# Patient Record
Sex: Male | Born: 1975 | Hispanic: Yes | Marital: Married | Smoking: Never smoker
Health system: Southern US, Community
[De-identification: ages and names within clinical notes are randomized; demographics above are authoritative.]

## PROBLEM LIST (undated history)

## (undated) DIAGNOSIS — I82409 Acute embolism and thrombosis of unspecified deep veins of unspecified lower extremity: Secondary | ICD-10-CM

## (undated) DIAGNOSIS — I10 Essential (primary) hypertension: Secondary | ICD-10-CM

---

## 2016-08-27 ENCOUNTER — Ambulatory Visit (INDEPENDENT_AMBULATORY_CARE_PROVIDER_SITE_OTHER)
Admission: EM | Admit: 2016-08-27 | Discharge: 2016-08-27 | Disposition: A | Discharge status: Other Acute Inpatient Hospital | Payer: Managed Care, Other (non HMO) | Source: Home / Self Care

## 2016-08-27 ENCOUNTER — Inpatient Hospital Stay: Payer: Managed Care, Other (non HMO)

## 2016-08-27 ENCOUNTER — Emergency Department: Payer: Managed Care, Other (non HMO)

## 2016-08-27 ENCOUNTER — Encounter: Payer: Self-pay | Admitting: Emergency Medicine

## 2016-08-27 ENCOUNTER — Inpatient Hospital Stay
Admission: EM | Admit: 2016-08-27 | Discharge: 2016-08-29 | DRG: 871 | Disposition: A | Discharge status: Home / Self Care | Payer: Managed Care, Other (non HMO) | Attending: Internal Medicine | Admitting: Internal Medicine

## 2016-08-27 DIAGNOSIS — E785 Hyperlipidemia, unspecified: Secondary | ICD-10-CM | POA: Diagnosis present

## 2016-08-27 DIAGNOSIS — Z792 Long term (current) use of antibiotics: Secondary | ICD-10-CM

## 2016-08-27 DIAGNOSIS — J9601 Acute respiratory failure with hypoxia: Secondary | ICD-10-CM

## 2016-08-27 DIAGNOSIS — I82412 Acute embolism and thrombosis of left femoral vein: Secondary | ICD-10-CM | POA: Diagnosis present

## 2016-08-27 DIAGNOSIS — A419 Sepsis, unspecified organism: Secondary | ICD-10-CM | POA: Diagnosis present

## 2016-08-27 DIAGNOSIS — I10 Essential (primary) hypertension: Secondary | ICD-10-CM | POA: Diagnosis present

## 2016-08-27 DIAGNOSIS — J9600 Acute respiratory failure, unspecified whether with hypoxia or hypercapnia: Secondary | ICD-10-CM

## 2016-08-27 DIAGNOSIS — I82502 Chronic embolism and thrombosis of unspecified deep veins of left lower extremity: Secondary | ICD-10-CM | POA: Diagnosis present

## 2016-08-27 DIAGNOSIS — R51 Headache: Secondary | ICD-10-CM

## 2016-08-27 DIAGNOSIS — M7989 Other specified soft tissue disorders: Secondary | ICD-10-CM

## 2016-08-27 DIAGNOSIS — Z7901 Long term (current) use of anticoagulants: Secondary | ICD-10-CM

## 2016-08-27 DIAGNOSIS — I2699 Other pulmonary embolism without acute cor pulmonale: Secondary | ICD-10-CM | POA: Diagnosis present

## 2016-08-27 DIAGNOSIS — R519 Headache, unspecified: Secondary | ICD-10-CM

## 2016-08-27 DIAGNOSIS — R7989 Other specified abnormal findings of blood chemistry: Secondary | ICD-10-CM

## 2016-08-27 DIAGNOSIS — R0902 Hypoxemia: Secondary | ICD-10-CM

## 2016-08-27 DIAGNOSIS — J189 Pneumonia, unspecified organism: Secondary | ICD-10-CM

## 2016-08-27 DIAGNOSIS — R778 Other specified abnormalities of plasma proteins: Secondary | ICD-10-CM

## 2016-08-27 DIAGNOSIS — R000 Tachycardia, unspecified: Secondary | ICD-10-CM | POA: Diagnosis present

## 2016-08-27 DIAGNOSIS — R0602 Shortness of breath: Secondary | ICD-10-CM | POA: Diagnosis not present

## 2016-08-27 DIAGNOSIS — Z79899 Other long term (current) drug therapy: Secondary | ICD-10-CM | POA: Diagnosis not present

## 2016-08-27 DIAGNOSIS — I82409 Acute embolism and thrombosis of unspecified deep veins of unspecified lower extremity: Secondary | ICD-10-CM | POA: Diagnosis not present

## 2016-08-27 DIAGNOSIS — J181 Lobar pneumonia, unspecified organism: Secondary | ICD-10-CM

## 2016-08-27 HISTORY — DX: Acute embolism and thrombosis of unspecified deep veins of unspecified lower extremity: I82.409

## 2016-08-27 HISTORY — DX: Essential (primary) hypertension: I10

## 2016-08-27 LAB — CBC WITH DIFFERENTIAL/PLATELET
BASOS PCT: 0 %
Basophils Absolute: 0 10*3/uL (ref 0–0.1)
EOS ABS: 0.1 10*3/uL (ref 0–0.7)
EOS PCT: 1 %
HCT: 47.5 % (ref 40.0–52.0)
Hemoglobin: 16 g/dL (ref 13.0–18.0)
Lymphocytes Relative: 11 %
Lymphs Abs: 1.3 10*3/uL (ref 1.0–3.6)
MCH: 28.4 pg (ref 26.0–34.0)
MCHC: 33.8 g/dL (ref 32.0–36.0)
MCV: 84 fL (ref 80.0–100.0)
MONO ABS: 0.7 10*3/uL (ref 0.2–1.0)
Monocytes Relative: 6 %
Neutro Abs: 9.2 10*3/uL — ABNORMAL HIGH (ref 1.4–6.5)
Neutrophils Relative %: 82 %
PLATELETS: 157 10*3/uL (ref 150–440)
RBC: 5.65 MIL/uL (ref 4.40–5.90)
RDW: 14.8 % — AB (ref 11.5–14.5)
WBC: 11.2 10*3/uL — AB (ref 3.8–10.6)

## 2016-08-27 LAB — MRSA PCR SCREENING: MRSA by PCR: NEGATIVE

## 2016-08-27 LAB — HEPARIN LEVEL (UNFRACTIONATED): HEPARIN UNFRACTIONATED: 0.65 [IU]/mL (ref 0.30–0.70)

## 2016-08-27 LAB — PROTIME-INR
INR: 1.07
Prothrombin Time: 13.9 seconds (ref 11.4–15.2)

## 2016-08-27 LAB — COMPREHENSIVE METABOLIC PANEL
ALT: 56 U/L (ref 17–63)
AST: 36 U/L (ref 15–41)
Albumin: 3.7 g/dL (ref 3.5–5.0)
Alkaline Phosphatase: 77 U/L (ref 38–126)
Anion gap: 7 (ref 5–15)
BUN: 16 mg/dL (ref 6–20)
CHLORIDE: 105 mmol/L (ref 101–111)
CO2: 27 mmol/L (ref 22–32)
CREATININE: 1.04 mg/dL (ref 0.61–1.24)
Calcium: 9 mg/dL (ref 8.9–10.3)
GFR calc non Af Amer: >60 mL/min (ref >60)
Glucose, Bld: 122 mg/dL — ABNORMAL HIGH (ref 65–99)
Potassium: 4.1 mmol/L (ref 3.5–5.1)
SODIUM: 139 mmol/L (ref 135–145)
Total Bilirubin: 0.7 mg/dL (ref 0.3–1.2)
Total Protein: 7.6 g/dL (ref 6.5–8.1)

## 2016-08-27 LAB — TROPONIN I: TROPONIN I: 0.2 ng/mL — AB (ref <0.03)

## 2016-08-27 LAB — APTT: APTT: 26 s (ref 24–36)

## 2016-08-27 LAB — LACTIC ACID, PLASMA: LACTIC ACID, VENOUS: 1.8 mmol/L (ref 0.5–1.9)

## 2016-08-27 LAB — BRAIN NATRIURETIC PEPTIDE: B NATRIURETIC PEPTIDE 5: 133 pg/mL — AB (ref 0.0–100.0)

## 2016-08-27 LAB — GLUCOSE, CAPILLARY: Glucose-Capillary: 136 mg/dL — ABNORMAL HIGH (ref 65–99)

## 2016-08-27 MED ORDER — METOPROLOL TARTRATE 5 MG/5ML IV SOLN
5.0000 mg | Freq: Four times a day (QID) | INTRAVENOUS | Status: DC | PRN
  Administered 2016-08-27: 5 mg via INTRAVENOUS
  Filled 2016-08-27 (×2): qty 5

## 2016-08-27 MED ORDER — OMEGA-3-ACID ETHYL ESTERS 1 G PO CAPS
1.0000 g | ORAL_CAPSULE | Freq: Two times a day (BID) | ORAL | Status: DC
  Administered 2016-08-27 – 2016-08-29 (×4): 1 g via ORAL
  Filled 2016-08-27 (×4): qty 1

## 2016-08-27 MED ORDER — ORAL CARE MOUTH RINSE
15.0000 mL | Freq: Two times a day (BID) | OROMUCOSAL | Status: DC
  Administered 2016-08-27 – 2016-08-29 (×4): 15 mL via OROMUCOSAL

## 2016-08-27 MED ORDER — HEPARIN (PORCINE) IN NACL 100-0.45 UNIT/ML-% IJ SOLN
1550.0000 [IU]/h | INTRAMUSCULAR | Status: DC
  Administered 2016-08-27 (×2): 1750 [IU]/h via INTRAVENOUS
  Filled 2016-08-27 (×3): qty 250

## 2016-08-27 MED ORDER — ACETAMINOPHEN 325 MG PO TABS
650.0000 mg | ORAL_TABLET | Freq: Four times a day (QID) | ORAL | Status: DC | PRN
  Administered 2016-08-28 – 2016-08-29 (×2): 650 mg via ORAL
  Filled 2016-08-27 (×2): qty 2

## 2016-08-27 MED ORDER — ALBUTEROL SULFATE (2.5 MG/3ML) 0.083% IN NEBU
2.5000 mg | INHALATION_SOLUTION | Freq: Once | RESPIRATORY_TRACT | Status: AC
  Administered 2016-08-27: 2.5 mg via RESPIRATORY_TRACT

## 2016-08-27 MED ORDER — TRAMADOL HCL 50 MG PO TABS: 50.0000 mg | ORAL_TABLET | Freq: Four times a day (QID) | ORAL | Status: DC | PRN

## 2016-08-27 MED ORDER — AZITHROMYCIN 250 MG PO TABS
250.0000 mg | ORAL_TABLET | Freq: Every day | ORAL | Status: DC
  Administered 2016-08-28 – 2016-08-29 (×2): 250 mg via ORAL
  Filled 2016-08-27 (×2): qty 1

## 2016-08-27 MED ORDER — ADULT MULTIVITAMIN W/MINERALS CH
1.0000 | ORAL_TABLET | Freq: Every day | ORAL | Status: DC
  Administered 2016-08-28 – 2016-08-29 (×2): 1 via ORAL
  Filled 2016-08-27 (×3): qty 1

## 2016-08-27 MED ORDER — ALBUTEROL SULFATE (2.5 MG/3ML) 0.083% IN NEBU: 2.5000 mg | INHALATION_SOLUTION | RESPIRATORY_TRACT | Status: DC | PRN

## 2016-08-27 MED ORDER — ACETAMINOPHEN 650 MG RE SUPP: 650.0000 mg | Freq: Four times a day (QID) | RECTAL | Status: DC | PRN

## 2016-08-27 MED ORDER — DEXTROSE 5 % IV SOLN
1.0000 g | Freq: Once | INTRAVENOUS | Status: AC
  Administered 2016-08-27: 1 g via INTRAVENOUS
  Filled 2016-08-27: qty 10

## 2016-08-27 MED ORDER — AZITHROMYCIN 500 MG IV SOLR
500.0000 mg | Freq: Once | INTRAVENOUS | Status: AC
  Administered 2016-08-27: 500 mg via INTRAVENOUS
  Filled 2016-08-27: qty 500

## 2016-08-27 MED ORDER — DEXTROSE 5 % IV SOLN
1.0000 g | INTRAVENOUS | Status: DC
  Administered 2016-08-28 – 2016-08-29 (×2): 1 g via INTRAVENOUS
  Filled 2016-08-27 (×4): qty 10

## 2016-08-27 MED ORDER — HEPARIN BOLUS VIA INFUSION
6000.0000 [IU] | Freq: Once | INTRAVENOUS | Status: AC
  Administered 2016-08-27: 6000 [IU] via INTRAVENOUS
  Filled 2016-08-27: qty 6000

## 2016-08-27 MED ORDER — IOPAMIDOL (ISOVUE-370) INJECTION 76%
75.0000 mL | Freq: Once | INTRAVENOUS | Status: AC | PRN
  Administered 2016-08-27: 75 mL via INTRAVENOUS

## 2016-08-27 MED ORDER — ONDANSETRON HCL 4 MG PO TABS: 4.0000 mg | ORAL_TABLET | Freq: Four times a day (QID) | ORAL | Status: DC | PRN

## 2016-08-27 MED ORDER — HYDRALAZINE HCL 20 MG/ML IJ SOLN: 10.0000 mg | Freq: Four times a day (QID) | INTRAMUSCULAR | Status: DC | PRN

## 2016-08-27 MED ORDER — GUAIFENESIN ER 600 MG PO TB12
1200.0000 mg | ORAL_TABLET | Freq: Two times a day (BID) | ORAL | Status: DC
  Administered 2016-08-27 – 2016-08-29 (×4): 1200 mg via ORAL
  Filled 2016-08-27 (×4): qty 2

## 2016-08-27 MED ORDER — ENOXAPARIN SODIUM 40 MG/0.4ML ~~LOC~~ SOLN: 40.0000 mg | SUBCUTANEOUS | Status: DC

## 2016-08-27 MED ORDER — ONDANSETRON HCL 4 MG/2ML IJ SOLN: 4.0000 mg | Freq: Four times a day (QID) | INTRAMUSCULAR | Status: DC | PRN

## 2016-08-27 MED ORDER — POLYETHYLENE GLYCOL 3350 17 G PO PACK: 17.0000 g | PACK | Freq: Every day | ORAL | Status: DC | PRN

## 2016-08-27 MED ORDER — METOPROLOL TARTRATE 5 MG/5ML IV SOLN: 5.0000 mg | Freq: Four times a day (QID) | INTRAVENOUS | Status: DC

## 2016-08-27 MED ORDER — SODIUM CHLORIDE 0.9 % IV SOLN
INTRAVENOUS | Status: DC
  Administered 2016-08-27 – 2016-08-28 (×3): via INTRAVENOUS

## 2016-08-27 NOTE — Progress Notes (Signed)
ANTICOAGULATION CONSULT NOTE - Initial Consult  Pharmacy Consult for Heparin Indication: pulmonary embolus  No Known Allergies  Patient Measurements: Height: 6' 1"  (185.4 cm) Weight: 267 lb 6.7 oz (121.3 kg) IBW/kg (Calculated) : 79.9 Heparin Dosing Weight: 105  Vital Signs: Temp: 100.2 F (37.9 C) (06/24 1600) Temp Source: Axillary (06/24 1600) BP: 133/94 (06/24 1800) Pulse Rate: 114 (06/24 1800)  Labs:  Recent Labs  08/27/16 1033 08/27/16 1210 08/27/16 1823  HGB 16.0  --   --   HCT 47.5  --   --   PLT 157  --   --   APTT  --  26  --   LABPROT  --  13.9  --   INR  --  1.07  --   HEPARINUNFRC  --   --  0.65  CREATININE 1.04  --   --   TROPONINI 0.20*  --   --     Estimated Creatinine Clearance: 126.3 mL/min (by C-G formula based on SCr of 1.04 mg/dL).   Medical History: Past Medical History:  Diagnosis Date  . DVT (deep venous thrombosis) ([place])   . Hypertension     Assessment: 41 y/o M with a h/o DVT admitted with PE. No h/o anticoagulants PTA.  Goal of Therapy:  Heparin level 0.3-0.7 units/ml Monitor platelets by anticoagulation protocol: Yes   Plan:  First HL therapeutic at 0.65. Continue drip at 1750 units/hr and check confirmation level in 6 hours.   Ramond Dial, Pharm.D, BCPS Clinical Pharmacist  08/27/2016,7:26 PM

## 2016-08-27 NOTE — H&P (Signed)
[facility] [place] NAME: Randall Lester    MR#:  [id number]  DATE OF BIRTH:  [birth year]  DATE OF ADMISSION:  08/27/2016  PRIMARY CARE PHYSICIAN: Valera Castle, MD   REQUESTING/REFERRING PHYSICIAN: Dr. Reita Cliche  CHIEF COMPLAINT:  Increasing shortness of breath, cough and postnasal drip  HISTORY OF PRESENT ILLNESS:  Randall Lester  is a 41 y.o. male with a known history of Hypertension not on any medications consist of the emergency room with history of cough and postnasal drip for 4 days. He denies any fever or chest pain was found to have increasing shortness of breath which got worse this morning and came to the emergency room with hypoxia sats in the 80s. Workup in the ER showed patient is tachycardic tachypneic elevated white count and chest x-ray significant for right middle lobe pneumonia. He received IV Rocephin and Zithromax and IV fluids. BNP is 133. Patient is being admitted with acute hypoxic respiratory failure, sepsis due to right lower lobe pneumonia.  PAST MEDICAL HISTORY:   Past Medical History:  Diagnosis Date  . DVT (deep venous thrombosis) ([place])   . Hypertension     PAST SURGICAL HISTOIRY:  History reviewed. No pertinent surgical history.  SOCIAL HISTORY:   Social History  Substance Use Topics  . Smoking status: Never Smoker  . Smokeless tobacco: Never Used  . Alcohol use Yes    FAMILY HISTORY:  History reviewed. No pertinent family history.  DRUG ALLERGIES:  No Known Allergies  REVIEW OF SYSTEMS:  Review of Systems  Constitutional: Positive for diaphoresis. Negative for chills, fever and weight loss.  HENT: Negative for ear discharge, ear pain and nosebleeds.   Eyes: Negative for blurred vision, pain and discharge.  Respiratory: Positive for cough and shortness of breath. Negative for sputum production, wheezing and stridor.   Cardiovascular: Negative for chest pain, palpitations,  orthopnea and PND.  Gastrointestinal: Negative for abdominal pain, diarrhea, nausea and vomiting.  Genitourinary: Negative for frequency and urgency.  Musculoskeletal: Negative for back pain and joint pain.  Neurological: Positive for weakness. Negative for sensory change, speech change and focal weakness.  Psychiatric/Behavioral: Negative for depression and hallucinations. The patient is not nervous/anxious.      MEDICATIONS AT HOME:   Prior to Admission medications   Medication Sig Start Date End Date Taking? Authorizing Provider  diphenhydrAMINE (BENADRYL) 25 MG tablet Take 25 mg by mouth every 6 (six) hours as needed.   Yes [provider]  guaiFENesin (MUCINEX) 600 MG 12 hr tablet Take 1,200 mg by mouth 2 (two) times daily as needed.   Yes [provider]  Multiple Vitamin (MULTIVITAMIN) tablet Take 1 tablet by mouth daily.   Yes [provider]  omega-3 acid ethyl esters (LOVAZA) 1 g capsule Take 1 g by mouth 2 (two) times daily.   Yes [provider]      VITAL SIGNS:  Blood pressure (!) 160/118, pulse (!) 129, resp. rate (!) 21, SpO2 94 %.  PHYSICAL EXAMINATION:  GENERAL:  41 y.o.-year-old patient lying in the bed with Mild acute distress.  EYES: Pupils equal, round, reactive to light and accommodation. No scleral icterus. Extraocular muscles intact.  HEENT: Head atraumatic, normocephalic. Oropharynx and nasopharynx clear. On nonrebreather mask NECK:  Supple, no jugular venous distention. No thyroid enlargement, no tenderness.  LUNGS: Decreased breath sounds bilaterally, no wheezing, rales,rhonchi or crepitation. No use of accessory muscles of respiration.  CARDIOVASCULAR: S1, S2 normal. No  murmurs, rubs, or gallops. Tachycardia ABDOMEN: Soft, nontender, nondistended. Bowel sounds present. No organomegaly or mass.  EXTREMITIES: No pedal edema, cyanosis, or clubbing. Chronic left lower extremity edema mild NEUROLOGIC: Cranial nerves II through  XII are intact. Muscle strength 5/5 in all extremities. Sensation intact. Gait not checked.  PSYCHIATRIC: The patient is alert and oriented x 3.  SKIN: No obvious rash, lesion, or ulcer.   LABORATORY PANEL:   CBC  Recent Labs Lab 08/27/16 1033  WBC 11.2*  HGB 16.0  HCT 47.5  PLT 157   ------------------------------------------------------------------------------------------------------------------  Chemistries   Recent Labs Lab 08/27/16 1033  NA 139  K 4.1  CL 105  CO2 27  GLUCOSE 122*  BUN 16  CREATININE 1.04  CALCIUM 9.0  AST 36  ALT 56  ALKPHOS 77  BILITOT 0.7   ------------------------------------------------------------------------------------------------------------------  Cardiac Enzymes  Recent Labs Lab 08/27/16 1033  TROPONINI 0.20*   ------------------------------------------------------------------------------------------------------------------  RADIOLOGY:  Dg Chest Port 1 View  Result Date: 08/27/2016 CLINICAL DATA:  Cough and not feeling well 4 days.  Hypoxia. EXAM: PORTABLE CHEST 1 VIEW COMPARISON:  None. FINDINGS: Lungs are adequately inflated with airspace consolidation over the right mid to lower lung likely right middle lobe pneumonia. No definite effusion. Subtle linear scarring/atelectasis left base. Cardiomediastinal silhouette is within normal. There is minimal calcified plaque over the aortic arch. Bones and soft tissues unremarkable. IMPRESSION: Airspace consolidation over the right midlung likely right middle lobe pneumonia. Aortic atherosclerosis. Electronically Signed   By: Marin Olp M.D.   On: 08/27/2016 10:38    EKG:   Sinus tachycardia IMPRESSION AND PLAN:   Randall Lester  is a 41 y.o. male with a known history of Hypertension not on any medications consist of the emergency room with history of cough and postnasal drip for 4 days. He denies any fever or chest pain was found to have increasing shortness of breath which  got worse this morning and came to the emergency room with hypoxia sats in the 80s. Workup in the ER showed patient is tachycardic tachypneic elevated white count and chest x-ray significant for right middle lobe pneumonia.  1. Acute hypoxic respiratory failure secondary to right middle lobe community acquired pneumonia -Admit to medical floor -IV Rocephin and Zithromax -Follow blood cultures sputum culture -Incentive spirometer -When necessary nebs -Consult pulmonary if no improvement -CT chest pending. Patient has history of DVT in the remote past not on any oral anticoagulation  2. Sepsis secondary to right middle lobe pneumonia -Patient presented with hypoxia, shortness of breath, tachypnea, tachycardia and chest x-ray abnormal with right middle lobe pneumonia and a limited white count 11.2 -IV antibiotics as above -Follow up labs  3. Tachycardia due to #1 and 2  4. Hyperlipidemia continue omega-3 fatty acid  5. DVT prophylaxis subcutaneous Lovenox  Above was discussed with patient's family  All the records are reviewed and case discussed with ED provider. Management plans discussed with the patient, family and they are in agreement.  CODE STATUS: Full  TOTAL CRITICAL TIME TAKING CARE OF THIS PATIENT: 50 minutes.    Ilhan Debenedetto M.D on 08/27/2016 at 11:37 AM  Between 7am to 6pm - Pager - [phone]  After 6pm go to [email or web address] - password [email or web address] [facility]  [facility]  Office  [phone]  CC: Primary care physician; Valera Castle, MD

## 2016-08-27 NOTE — Progress Notes (Signed)
Patient a CT scan chest shows bilateral PE with right heart strain, dense consolidation right lower lobe with possible right lung infarct.  Above was discussed with patient and family. Patient is to be started on IV heparin drip. Dr. Jefferson Fuel from ICU is here to see patient. Will admit patient to ICU.  Patient is a full code.

## 2016-08-27 NOTE — ED Triage Notes (Signed)
Pt with increasing SOB x past 4 days much worse this a.m.

## 2016-08-27 NOTE — ED Notes (Signed)
Pt stated he needed to urinate. Informed that he does not need to move around too much. Given urinal and informed to sit on side of bed to urinate. Family stepped outside door.

## 2016-08-27 NOTE — Progress Notes (Addendum)
Pt ultrasound positive for DVT in left femoral popliteal. Elink called and informed. Pt remains on heparin drip, 15 L NRB, currently in no apparent distress. Awaiting call back from Dr. Tamala Julian.

## 2016-08-27 NOTE — ED Notes (Signed)
Pt taken to CT via stretcher.

## 2016-08-27 NOTE — ED Notes (Signed)
Dr. Reita Cliche at bedside.

## 2016-08-27 NOTE — Consult Note (Signed)
[facility] Consultation   ASSESSMENT/PLAN   Bilateral submassive pulmonary emboli patient with bilateral pulmonary emboli. Prior history of left-sided DVT. Recent anterior cruciate ligament repair. Hemodynamics are stable with blood pressure 170. Although does have evidence of right ventricular strain would not give thrombolytics at this point unless patient becomes hemodynamically unstable. Oxygen saturation is 97% on nonrebreather mask. Agree with systemic anticoagulation and admission to the intensive care unit. His status deteriorates would most certainly give TPA. Will obtain lower extremity Dopplers and echocardiography to assess pulmonary artery pressure right and left ventricular function  Pneumonia. CT scan does reveal evidence of parenchymal infiltrates in the middle and superior segment of the right lower lobe. He does have some evidence of congestion, chills and mild leukocytosis agree with azithromycin and Rocephin empirically  Positive troponin at 0.2. EKG reveals sinus tachycardia without any acute ST segment changes. This most likely is reflective of supply demand ischemia and right ventricular overload    Mild leukocytosis. Started on broad-spectrum anabiotic coverage   INTAKE / OUTPUT: No intake or output data in the 24 hours ending 08/27/16 1233  Name: Arafat Cocuzza MRN: [id number] DOB: March 09, 1975    ADMISSION DATE:  08/27/2016 CONSULTATION DATE:  08/27/2016  REFERRING MD :  Hospitalist service  CHIEF COMPLAINT:  Shortness of breath   HISTORY OF PRESENT ILLNESS:  Mr. Gonyea is a very pleasant 41 year old gentleman with a past medical history remarkable for hypertension, deep venous thrombosis, recent anterior cruciate ligament repair, left lower extremity edema, presents with 4 days of progressive worsening cough, progressive shortness of breath, postnasal drip. He states that he initially thought he was having some allergic type  symptoms, had postnasal drip and congestion. He tried taking over-the-counter Mucinex and Benadryl without improvement. Today his progressive shortness of breath became much more severe prompting him to come to the emergency department.  CT scan angiogram of the chest revealed bilateral pulmonary emboli with evidence of right ventricular strain. Also noted was right middle and superior segment of the right lower lobe parenchymal infiltrates consistent with pneumonic process. He denies any hemoptysis, productive secretions, but does have some sweats and chills.   PAST MEDICAL HISTORY :  Past Medical History:  Diagnosis Date  . DVT (deep venous thrombosis) ([place])   . Hypertension    History reviewed. No pertinent surgical history. Prior to Admission medications   Medication Sig Start Date End Date Taking? Authorizing Provider  diphenhydrAMINE (BENADRYL) 25 MG tablet Take 25 mg by mouth every 6 (six) hours as needed.   Yes [provider]  guaiFENesin (MUCINEX) 600 MG 12 hr tablet Take 1,200 mg by mouth 2 (two) times daily as needed.   Yes [provider]  Multiple Vitamin (MULTIVITAMIN) tablet Take 1 tablet by mouth daily.   Yes [provider]  omega-3 acid ethyl esters (LOVAZA) 1 g capsule Take 1 g by mouth 2 (two) times daily.   Yes [provider]   No Known Allergies  FAMILY HISTORY:  History reviewed. No pertinent family history. SOCIAL HISTORY:  reports that he has never smoked. He has never used smokeless tobacco. He reports that he drinks alcohol. He reports that he does not use drugs.  REVIEW OF SYSTEMS:   Please see history of present illness for pertinent positives The remainder of systems were reviewed and were found to be negative other than what is documented in the HPI.    VITAL SIGNS: Pulse Rate:  [122-135] 132 (06/24 1200) Resp:  [  19-36] 22 (06/24 1200) BP: (153-170)/(103-124) 170/124 (06/24 1200) SpO2:  [72 %-94 %] 91 % (06/24  1200) Weight:  [117.9 kg (260 lb)] 117.9 kg (260 lb) (06/24 0942)   INTAKE / OUTPUT: No intake or output data in the 24 hours ending 08/27/16 1233  Physical Examination:   VS: BP (!) 170/124   Pulse (!) 132   Resp (!) 22   SpO2 91%   General Appearance: Mild respiratory distress, awake, alert Neuro:without focal findings, mental status, speech normal,. HEENT: PERRLA, EOM intact, no ptosis, no other lesions noticed;  Pulmonary: normal breath sounds., diaphragmatic excursion normal, accessory muscle utilization noted, essentially clear to auscultation CardiovascularNormal S1,S2.  No m/r/g.    Abdomen: Benign, Soft, non-tender, No masses, hepatosplenomegaly, No lymphadenopathy Renal:  No costovertebral tenderness  Skin:   warm, no rashes, no ecchymosis  Extremities: Asymmetric left lower extremity swelling   LABS: Reviewed   LABORATORY PANEL:   CBC  Recent Labs Lab 08/27/16 1033  WBC 11.2*  HGB 16.0  HCT 47.5  PLT 157    Chemistries   Recent Labs Lab 08/27/16 1033  NA 139  K 4.1  CL 105  CO2 27  GLUCOSE 122*  BUN 16  CREATININE 1.04  CALCIUM 9.0  AST 36  ALT 56  ALKPHOS 77  BILITOT 0.7    No results for input(s): GLUCAP in the last 168 hours. No results for input(s): PHART, PCO2ART, PO2ART in the last 168 hours.  Recent Labs Lab 08/27/16 1033  AST 36  ALT 56  ALKPHOS 77  BILITOT 0.7  ALBUMIN 3.7    Cardiac Enzymes  Recent Labs Lab 08/27/16 1033  TROPONINI 0.20*    RADIOLOGY:  Ct Angio Chest Pe W/cm &/or Wo Cm  Result Date: 08/27/2016 CLINICAL DATA:  Shortness of breath for 4 days, productive cough. History of DVT and left leg swelling. Decreased O2 sats. EXAM: CT ANGIOGRAPHY CHEST WITH CONTRAST TECHNIQUE: Multidetector CT imaging of the chest was performed using the standard protocol during bolus administration of intravenous contrast. Multiplanar CT image reconstructions and MIPs were obtained to evaluate the vascular anatomy. CONTRAST:   75 cc Isovue 370 COMPARISON:  None. FINDINGS: Cardiovascular: Moderate to large pulmonary emboli are seen within the distal left main pulmonary artery extending into the left interlobar pulmonary artery and multiple segmental pulmonary artery branches to the left upper lobe, lingula and left lower lobe. Additional moderate load of pulmonary embolus is seen within the right interlobar pulmonary artery extending into multiple segmental pulmonary artery branches to the right upper lobe, right middle lobe and right lower lobe. The right ventricle is distended indicating some degree of right heart strain. Overall heart size is normal. No pericardial effusion. Thoracic aorta is normal in caliber and configuration. Mediastinum/Nodes: No mass or enlarged lymph nodes within the mediastinum or perihilar regions. Small lymph nodes in the AP window region of the mediastinum. Normal residual thymic tissue in the anterior mediastinum. Lungs/Pleura: Dense consolidations within the right middle lobe and within the superior segment of the right lower lobe. Additional patchy ground-glass opacities within the right lower lobe and right middle lobe, most likely edema. Milder edema within the left lower lobe. Upper Abdomen: No acute findings. Musculoskeletal: No acute or significant osseous finding. Review of the MIP images confirms the above findings. IMPRESSION: 1. Positive for acute PE with CT evidence of right heart strain (RV/LV Ratio = nearly 2) consistent with at least submassive (intermediate risk) PE. The presence of right heart strain has  been associated with an increased risk of morbidity and mortality. Please activate Code PE by paging [phone]. Bilateral pulmonary emboli, as detailed above. 2. Dense consolidations within the right middle lobe and superior segment of the right lower lobe, likely multifocal pneumonia. 3. Additional patchy ground-glass opacities within the right middle lobe and right lower lobe, and to a  lesser degree in the left lower lobe, most likely pulmonary edema, some components likely sequela of pulmonary infarct. These results were called by telephone at the time of interpretation on 08/27/2016 at 11:55 am to Dr. Lisa Roca , who verbally acknowledged these results. Electronically Signed   By: Franki Cabot M.D.   On: 08/27/2016 12:04   Dg Chest Port 1 View  Result Date: 08/27/2016 CLINICAL DATA:  Cough and not feeling well 4 days.  Hypoxia. EXAM: PORTABLE CHEST 1 VIEW COMPARISON:  None. FINDINGS: Lungs are adequately inflated with airspace consolidation over the right mid to lower lung likely right middle lobe pneumonia. No definite effusion. Subtle linear scarring/atelectasis left base. Cardiomediastinal silhouette is within normal. There is minimal calcified plaque over the aortic arch. Bones and soft tissues unremarkable. IMPRESSION: Airspace consolidation over the right midlung likely right middle lobe pneumonia. Aortic atherosclerosis. Electronically Signed   By: Marin Olp M.D.   On: 08/27/2016 10:38    Hermelinda Dellen, DO [facility] Office Number: [phone]  Patricia Pesa, M.D.  Merton Border, M.D   08/27/2016, 12:33 PM

## 2016-08-27 NOTE — ED Triage Notes (Signed)
Pt to ED from [facility] with c/o of SOB x4 days. Pt woke this morning with increased difficulty. Pt has productive cough. Pt has hx of DVT and presents with swelling in left leg. Upon arrival  Pt placed on 6L and Sat at 80. Pt placed on non rebreather mask and Sat at 88%.

## 2016-08-27 NOTE — ED Provider Notes (Signed)
[facility] ____________________________________________  Time seen: Approximately 9:56 AM  I have reviewed the triage vital signs and the nursing notes.   HISTORY  Chief Complaint Shortness of Breath  HPI Randall Lester is a 41 y.o. male  present for evaluation of shortness of breath. Patient and wife at bedside report that for the last 4 days he has had progressing worsening cough with accompanied shortness of breath, woke up this morning very short of breath. Patient states that he initially thought he was having some allergy issues as he has family in town and didn't know if it was from perfumes or smells. Also reports that they have a niece that currently has nasal congestion. Patient reports he is taking over-the-counter Mucinex and Benadryl without change. Last took Mucinex and Benadryl this morning. Wife reports in the way to the urgent care he was starting to have episodes where he seems like he was passing out and she Did Talk to Him to Keep Him Awake. Patient Denies pain but only states he cannot take a full deep breath.  Reports history of high blood pressure, chronic left leg swelling with history of anterior cruciate ligament repair with DVT in left leg postoperatively. Patient states no chest pain or extremity pain. Denies any recent fevers. Denies history of asthma. States nonsmoker.Denies recent sickness. Denies recent antibiotic use.    Past Medical History:  Diagnosis Date  . DVT (deep venous thrombosis) ([place])   . Hypertension     There are no active problems to display for this patient.   History reviewed. No pertinent surgical history.   No current facility-administered medications for this encounter.  No current outpatient prescriptions on file.  Allergies Patient has no known allergies.  History reviewed. No pertinent family history.  Social History Social History  Substance Use Topics  . Smoking status: Never Smoker  . Smokeless  tobacco: Never Used  . Alcohol use Yes    Review of Systems Constitutional: No fever/chills ENT: No sore throat. Cardiovascular: Denies chest pain. Respiratory: positive shortness of breath. Gastrointestinal: No abdominal pain.    ____________________________________________   PHYSICAL EXAM:  VITAL SIGNS: ED Triage Vitals  Enc Vitals Group     BP 08/27/16 0933 (!) 168/103     Pulse Rate 08/27/16 0933 (!) 135     Resp 08/27/16 0933 (!) 36     Temp --      Temp src --      SpO2 08/27/16 0933 (!) 72 %     Weight 08/27/16 0942 260 lb (117.9 kg)     Height 08/27/16 0942 6' 1"  (1.854 m)     Head Circumference --      Peak Flow --      Pain Score --      Pain Loc --      Pain Edu? --      Excl. in [place]? --     Constitutional: Alert and oriented. Well appearing and in no acute distress. ENT      Mouth/Throat: Mucous membranes are moist.Oropharynx non-erythematous. No edema noted Cardiovascular: Normal rate, regular rhythm. Grossly normal heart sounds.  Good peripheral circulation. Respiratory: No wheezes. Diffuse rhonchi. Speaking 2-3 word sentences. Accessory muscle use. Gastrointestinal: Soft and nontender.  Musculoskeletal:  Left lower leg nonpitting edema, no edema noted to right leg. Left leg nontender. Bilateral pedal pulses equal and easily palpated. Neurologic:  Normal speech and language.  Speech is normal. No gait instability.  Skin:  Skin is warm, dry.  Psychiatric: Mood and affect are normal. Speech and behavior are normal. Patient exhibits appropriate insight and judgment   ___________________________________________   LABS (all labs ordered are listed, but only abnormal results are displayed)  Labs Reviewed - No data to display ____________________________________________  EKG  ED ECG REPORT I, Marylene Land, the attending provider, personally viewed and interpreted this ECG.   Date: 08/27/2016  EKG Time: 0938  Rate: 121  Rhythm: normal EKG, normal  sinus rhythm, unchanged from previous tracings, sinus tachycardia  Axis: normal  Intervals:none  ST&T Change: no ST or T wave elevation or depression noted  ____________________________________________  RADIOLOGY  No results found. ____________________________________________   PROCEDURES Procedures    INITIAL IMPRESSION / ASSESSMENT AND PLAN / ED COURSE  Pertinent labs & imaging results that were available during my care of the patient were reviewed by me and considered in my medical decision making (see chart for details).  Presenting with wife at bedside for respiratory distress and shortness of breath. Progressively worsening the last few days with quick decline this morning per patient. Patient oxygenation saturation 65-72% room air upon initial arrival. Nonrebreather nonrebreather applied and patient went to 90% at highest. 1 albuterol neb given without change shortness of breath or oxygenation saturation. EMS called Arrival. blood sugar 119. EKG sinus tach. IV established. EMS to transport to [facility]. Marya Amsler RN charge nurse called and given report. Wife and patient verbalized understanding and agreed to this plan.  ____________________________________________   FINAL CLINICAL IMPRESSION(S) / ED DIAGNOSES  Final diagnoses:  SOB (shortness of breath)  Acute respiratory failure with hypoxia ([place])     There are no discharge medications for this patient.   Note: This dictation was prepared with Dragon dictation along with smaller phrase technology. Any transcriptional errors that result from this process are unintentional.         Marylene Land, NP 08/27/16 1012

## 2016-08-27 NOTE — Progress Notes (Signed)
ANTICOAGULATION CONSULT NOTE - Initial Consult  Pharmacy Consult for Heparin Indication: pulmonary embolus  No Known Allergies  Patient Measurements:   Heparin Dosing Weight: 105  Vital Signs: BP: 160/118 (06/24 1130) Pulse Rate: 129 (06/24 1130)  Labs:  Recent Labs  08/27/16 1033  HGB 16.0  HCT 47.5  PLT 157  CREATININE 1.04  TROPONINI 0.20*    Estimated Creatinine Clearance: 124.5 mL/min (by C-G formula based on SCr of 1.04 mg/dL).   Medical History: Past Medical History:  Diagnosis Date  . DVT (deep venous thrombosis) ([place])   . Hypertension     Assessment: 41 y/o M with a h/o DVT admitted with PE. No h/o anticoagulants PTA.  Goal of Therapy:  Heparin level 0.3-0.7 units/ml Monitor platelets by anticoagulation protocol: Yes   Plan:  Give 6000 units bolus x 1 Start heparin infusion at 1750 units/hr Check anti-Xa level in 6 hours and daily while on heparin Continue to monitor H&H and platelets  Ulice Dash D 08/27/2016,12:11 PM

## 2016-08-27 NOTE — ED Provider Notes (Signed)
[facility] Emergency Department Provider Note ____________________________________________   I have reviewed the triage vital signs and the triage nursing note.  HISTORY  Chief Complaint Shortness of Breath   Historian Patient  HPI Randall Lester is a 41 y.o. male with a history of hypertension that has been controlled after weight loss, and a history of remote DVT in the left lower extremity which has caused some chronic leg swelling, but no active DVT that he knows of, and no new or worsening swelling or pain in his leg, presents from urgent care today after being found to have hypoxia into the 60s on room air.  Patient had presented with complaint for shortness of breath for approximately 4 days associated with worsening mildly productive cough. No fevers. He thought he might be having allergies to dogs, and tried Mucinex and Benadryl without any relief.  Denies chest pain. Denies pleuritic pain. Denies leg or calf pain, as well as does have chronic left lower extremity swelling.    Past Medical History:  Diagnosis Date  . DVT (deep venous thrombosis) ([place])   . Hypertension     Patient Active Problem List   Diagnosis Date Noted  . Sepsis ([place]) 08/27/2016  . Acute respiratory failure with hypoxia ([place]) 08/27/2016    History reviewed. No pertinent surgical history.  Prior to Admission medications   Medication Sig Start Date End Date Taking? Authorizing Provider  diphenhydrAMINE (BENADRYL) 25 MG tablet Take 25 mg by mouth every 6 (six) hours as needed.   Yes [provider]  guaiFENesin (MUCINEX) 600 MG 12 hr tablet Take 1,200 mg by mouth 2 (two) times daily as needed.   Yes [provider]  Multiple Vitamin (MULTIVITAMIN) tablet Take 1 tablet by mouth daily.   Yes [provider]  omega-3 acid ethyl esters (LOVAZA) 1 g capsule Take 1 g by mouth 2 (two) times daily.   Yes [provider]    No Known  Allergies  History reviewed. No pertinent family history.  Social History Social History  Substance Use Topics  . Smoking status: Never Smoker  . Smokeless tobacco: Never Used  . Alcohol use Yes    Review of Systems  Constitutional: Negative for fever. Eyes: Negative for visual changes. ENT: Negative for sore throat. Cardiovascular: Negative for chest pain. Respiratory: Positive for shortness of breath. Gastrointestinal: Negative for abdominal pain, vomiting and diarrhea. Genitourinary: Negative for dysuria. Musculoskeletal: Negative for back pain. Skin: Negative for rash. Neurological: Negative for headache.  ____________________________________________   PHYSICAL EXAM:  VITAL SIGNS: ED Triage Vitals  Enc Vitals Group     BP --      Pulse Rate 08/27/16 1028 (!) 123     Resp 08/27/16 1028 (!) 21     Temp --      Temp src --      SpO2 08/27/16 1023 (!) 80 %     Weight --      Height --      Head Circumference --      Peak Flow --      Pain Score --      Pain Loc --      Pain Edu? --      Excl. in [place]? --      Constitutional: Alert and oriented. Well appearing and Moderately dyspneic, trouble with full sentences. HEENT   Head: Normocephalic and atraumatic.      Eyes: Conjunctivae are normal. Pupils equal and round.  Ears:         Nose: No congestion/rhinnorhea.   Mouth/Throat: Mucous membranes are moist.   Neck: No stridor. Cardiovascular/Chest: Tachycardic, regular rhythm.  No murmurs, rubs, or gallops. Respiratory: Tachypnea with decreased breath sounds on the right and rhonchi bilateral bases. No clear wheezing. Gastrointestinal: Soft. No distention, no guarding, no rebound. Nontender.    Genitourinary/rectal:Deferred Musculoskeletal: Nontender with normal range of motion in all extremities. LLE with unilateral swelling without calf tenderness. Neurologic:  Normal speech and language. No gross or focal neurologic deficits are  appreciated. Skin:  Skin is warm, dry and intact. No rash noted. Psychiatric: Mood and affect are normal. Speech and behavior are normal. Patient exhibits appropriate insight and judgment.   ____________________________________________  LABS (pertinent positives/negatives)  Labs Reviewed  TROPONIN I - Abnormal; Notable for the following:       Result Value   Troponin I 0.20 (*)    All other components within normal limits  CBC WITH DIFFERENTIAL/PLATELET - Abnormal; Notable for the following:    WBC 11.2 (*)    RDW 14.8 (*)    Neutro Abs 9.2 (*)    All other components within normal limits  COMPREHENSIVE METABOLIC PANEL - Abnormal; Notable for the following:    Glucose, Bld 122 (*)    All other components within normal limits  BRAIN NATRIURETIC PEPTIDE - Abnormal; Notable for the following:    B Natriuretic Peptide 133.0 (*)    All other components within normal limits  CULTURE, BLOOD (ROUTINE X 2)  CULTURE, BLOOD (ROUTINE X 2)  LACTIC ACID, PLASMA  LACTIC ACID, PLASMA  APTT  PROTIME-INR    ____________________________________________    EKG I, Lisa Roca, MD, the attending physician have personally viewed and interpreted all ECGs.  124 bpm. Sinus tachycardia. Narrow QRS. Normal axis. Nonspecific ST and T-wave ____________________________________________  RADIOLOGY All Xrays were viewed by me. Imaging interpreted by Radiologist.  CXR portable:  IMPRESSION: Airspace consolidation over the right midlung likely right middle lobe pneumonia.  Aortic atherosclerosis.  CT angio chest: IMPRESSION: 1. Positive for acute PE with CT evidence of right heart strain (RV/LV Ratio = nearly 2) consistent with at least submassive (intermediate risk) PE. The presence of right heart strain has been associated with an increased risk of morbidity and mortality. Please activate Code PE by paging [phone]. Bilateral pulmonary emboli, as detailed above. 2. Dense consolidations  within the right middle lobe and superior segment of the right lower lobe, likely multifocal pneumonia. 3. Additional patchy ground-glass opacities within the right middle lobe and right lower lobe, and to a lesser degree in the left lower lobe, most likely pulmonary edema, some components likely sequela of pulmonary infarct.  These results were called by telephone at the time of interpretation on 08/27/2016 at 11:55 am to Dr. Lisa Roca , who verbally acknowledged these results. __________________________________________  PROCEDURES  Procedure(s) performed: None  Critical Care performed: CRITICAL CARE Performed by: Lisa Roca   Total critical care time: 45 minutes  Critical care time was exclusive of separately billable procedures and treating other patients.  Critical care was necessary to treat or prevent imminent or life-threatening deterioration.  Critical care was time spent personally by me on the following activities: development of treatment plan with patient and/or surrogate as well as nursing, discussions with consultants, evaluation of patient's response to treatment, examination of patient, obtaining history from patient or surrogate, ordering and performing treatments and interventions, ordering and review of laboratory studies, ordering and review of  radiographic studies, pulse oximetry and re-evaluation of patient's condition.   ____________________________________________   ED COURSE / ASSESSMENT AND PLAN  Pertinent labs & imaging results that were available during my care of the patient were reviewed by me and considered in my medical decision making (see chart for details).  Patient placed on nonrebreather and was still 89% on nonrebreather. He is not in significant distress, but does have short sentences due to feeling short of breath. No hypotension or significant hypertension. Denies fevers. Initial thought was most likely acute CHF with lower extremity  edema, or possible PE, although patient states that the left lower extremity edema is chronic. Portable chest x-ray shows asymmetric, right sided lower lobe infiltrate more consistent with pneumonia. At this point with tachycardia and hypoxia and infiltrate I did start patient on sepsis pathway and initiated antibiotics for community acquired pneumonia.  Hypoxia out of proportion to the chest x-ray findings and clinical scenario in terms of pneumonia but to CT of the chest.  I received call from radiologist reporting significant bilateral pulmonary emboli with evidence for right heart strain. In addition to this consolidations concerning for edema and/or pneumonia and/or pulmonary infarct.  I discussed these results with hospitalist, Dr. Posey Pronto who discussed with ICU attending Dr. Jefferson Fuel who recommends ICU admission but does not recommend Transfer for any thrombolysis or thrombectomy at this point.  Patient was ordered to start heparin.  Patient and family were updated.  O2 sat around 90% on nonrebreather. Patient relatively comfortable in terms of breathing and able to speak and interactive.  We discussed the minimal troponin 0.2, likely due to strain    CONSULTATIONS:   Hospitalist for admission - Dr. Posey Pronto, face to face.  Dr. Jefferson Fuel, ICU attending to admit.  Patient / Family / Caregiver informed of clinical course, medical decision-making process, and agree with plan.  ________________   FINAL CLINICAL IMPRESSION(S) / ED DIAGNOSES   Final diagnoses:  Community acquired pneumonia of left lower lobe of lung ([place])  Hypoxia  Bilateral pulmonary embolism ([facility])  Elevated troponin  Pulmonary infarct [facility])              Note: This dictation was prepared with Dragon dictation. Any transcriptional errors that result from this process are unintentional    Lisa Roca, MD 08/27/16 1213

## 2016-08-28 ENCOUNTER — Inpatient Hospital Stay: Payer: Managed Care, Other (non HMO)

## 2016-08-28 ENCOUNTER — Inpatient Hospital Stay: Admit: 2016-08-28 | Payer: Managed Care, Other (non HMO)

## 2016-08-28 ENCOUNTER — Inpatient Hospital Stay (HOSPITAL_COMMUNITY)
Admit: 2016-08-28 | Discharge: 2016-08-28 | Disposition: A | Discharge status: Home / Self Care | Payer: Managed Care, Other (non HMO) | Attending: Adult Health | Admitting: Adult Health

## 2016-08-28 DIAGNOSIS — I2699 Other pulmonary embolism without acute cor pulmonale: Secondary | ICD-10-CM

## 2016-08-28 LAB — ECHOCARDIOGRAM COMPLETE
Area-P 1/2: 2.59 cm2
CHL CUP MV DEC (S): 289
E decel time: 289 msec
EERAT: 6.17
FS: 33 % (ref 28–44)
Height: 73 in
IV/PV OW: 1.1
LA diam end sys: 40 mm
LA diam index: 1.58 cm/m2
LA vol A4C: 42.2 ml
LASIZE: 40 mm
LV E/e' medial: 6.17
LV TDI E'LATERAL: 14.4
LVEEAVG: 6.17
LVELAT: 14.4 cm/s
Lateral S' vel: 11.1 cm/s
MV Peak grad: 3 mmHg
MV pk E vel: 88.8 m/s
MVPKAVEL: 82.5 m/s
MVSPHT: 85 ms
PW: 9.94 mm — AB (ref 0.6–1.1)
TAPSE: 19.4 mm
TDI e' medial: 5.66
Weight: 4278.4 oz

## 2016-08-28 LAB — BASIC METABOLIC PANEL
ANION GAP: 7 (ref 5–15)
BUN: 14 mg/dL (ref 6–20)
CALCIUM: 8.8 mg/dL — AB (ref 8.9–10.3)
CO2: 24 mmol/L (ref 22–32)
Chloride: 104 mmol/L (ref 101–111)
Creatinine, Ser: 0.8 mg/dL (ref 0.61–1.24)
GLUCOSE: 136 mg/dL — AB (ref 65–99)
POTASSIUM: 4.5 mmol/L (ref 3.5–5.1)
Sodium: 135 mmol/L (ref 135–145)

## 2016-08-28 LAB — CBC
HCT: 45 % (ref 40.0–52.0)
HEMOGLOBIN: 15.3 g/dL (ref 13.0–18.0)
MCH: 28.3 pg (ref 26.0–34.0)
MCHC: 34 g/dL (ref 32.0–36.0)
MCV: 83.3 fL (ref 80.0–100.0)
PLATELETS: 144 10*3/uL — AB (ref 150–440)
RBC: 5.41 MIL/uL (ref 4.40–5.90)
RDW: 14.7 % — ABNORMAL HIGH (ref 11.5–14.5)
WBC: 12.7 10*3/uL — AB (ref 3.8–10.6)

## 2016-08-28 LAB — HEPARIN LEVEL (UNFRACTIONATED)
Heparin Unfractionated: 0.75 IU/mL — ABNORMAL HIGH (ref 0.30–0.70)
Heparin Unfractionated: 0.54 IU/mL (ref 0.30–0.70)

## 2016-08-28 LAB — MAGNESIUM: MAGNESIUM: 1.9 mg/dL (ref 1.7–2.4)

## 2016-08-28 LAB — PHOSPHORUS: PHOSPHORUS: 3.2 mg/dL (ref 2.5–4.6)

## 2016-08-28 MED ORDER — HYDRALAZINE HCL 20 MG/ML IJ SOLN
10.0000 mg | Freq: Once | INTRAMUSCULAR | Status: AC
  Administered 2016-08-28: 10 mg via INTRAVENOUS
  Filled 2016-08-28: qty 1

## 2016-08-28 MED ORDER — LISINOPRIL 10 MG PO TABS: 10.0000 mg | ORAL_TABLET | Freq: Every day | ORAL | Status: DC

## 2016-08-28 MED ORDER — HEPARIN (PORCINE) IN NACL 100-0.45 UNIT/ML-% IJ SOLN: 1550.0000 [IU]/h | INTRAMUSCULAR | Status: DC

## 2016-08-28 MED ORDER — APIXABAN 5 MG PO TABS: 5.0000 mg | ORAL_TABLET | Freq: Two times a day (BID) | ORAL | Status: DC

## 2016-08-28 MED ORDER — APIXABAN 5 MG PO TABS
10.0000 mg | ORAL_TABLET | Freq: Two times a day (BID) | ORAL | Status: DC
  Administered 2016-08-28 – 2016-08-29 (×3): 10 mg via ORAL
  Filled 2016-08-28 (×3): qty 2

## 2016-08-28 NOTE — Progress Notes (Addendum)
Report called to [facility] on 1C, pt transported by NT with supp O2.  VSS stable, MD started lisinopril for elevated BP

## 2016-08-28 NOTE — Progress Notes (Signed)
ANTICOAGULATION CONSULT NOTE - Initial Consult  Pharmacy Consult for Heparin Indication: pulmonary embolus  No Known Allergies  Patient Measurements: Height: 6' 1"  (185.4 cm) Weight: 267 lb 6.7 oz (121.3 kg) IBW/kg (Calculated) : 79.9 Heparin Dosing Weight: 105  Vital Signs: Temp: 97.9 F (36.6 C) (06/25 0000) Temp Source: Oral (06/25 0000) BP: 132/91 (06/25 0400) Pulse Rate: 79 (06/25 0400)  Labs:  Recent Labs  08/27/16 1033 08/27/16 1210 08/27/16 1823 08/28/16 0053  HGB 16.0  --   --  15.3  HCT 47.5  --   --  45.0  PLT 157  --   --  144*  APTT  --  26  --   --   LABPROT  --  13.9  --   --   INR  --  1.07  --   --   HEPARINUNFRC  --   --  0.65 0.75*  CREATININE 1.04  --   --  0.80  TROPONINI 0.20*  --   --   --     Estimated Creatinine Clearance: 164.2 mL/min (by C-G formula based on SCr of 0.8 mg/dL).   Medical History: Past Medical History:  Diagnosis Date  . DVT (deep venous thrombosis) ([place])   . Hypertension     Assessment: 41 y/o M with a h/o DVT admitted with PE. No h/o anticoagulants PTA.  Goal of Therapy:  Heparin level 0.3-0.7 units/ml Monitor platelets by anticoagulation protocol: Yes   Plan:  First HL therapeutic at 0.65. Continue drip at 1750 units/hr and check confirmation level in 6 hours.   6/25 @ 0100 HL 0.75 supratherapeutic. Will decrease rate to 1550 units/hr and will recheck HL @ 1100.  Tobie Lords, PharmD, BCPS Clinical Pharmacist 08/28/2016

## 2016-08-28 NOTE — Progress Notes (Signed)
[place] [facility] [place] NAME: Randall Lester    MR#:  [id number]  DATE OF BIRTH:  June 07, 1975  SUBJECTIVE:  CHIEF COMPLAINT:   Chief Complaint  Patient presents with  . Shortness of Breath  feeling much better. No new complaints REVIEW OF SYSTEMS:  Review of Systems  Constitutional: Negative for chills, fever and weight loss.  HENT: Negative for nosebleeds and sore throat.   Eyes: Negative for blurred vision.  Respiratory: Positive for shortness of breath. Negative for cough and wheezing.   Cardiovascular: Negative for chest pain, orthopnea, leg swelling and PND.  Gastrointestinal: Negative for abdominal pain, constipation, diarrhea, heartburn, nausea and vomiting.  Genitourinary: Negative for dysuria and urgency.  Musculoskeletal: Negative for back pain.  Skin: Negative for rash.  Neurological: Negative for dizziness, speech change, focal weakness and headaches.  Endo/Heme/Allergies: Does not bruise/bleed easily.  Psychiatric/Behavioral: Negative for depression.   DRUG ALLERGIES:  No Known Allergies VITALS:  Blood pressure 137/87, pulse 90, temperature 97.7 F (36.5 C), temperature source Oral, resp. rate 18, height 6' 1"  (1.854 m), weight 121.3 kg (267 lb 6.4 oz), SpO2 98 %. PHYSICAL EXAMINATION:  Physical Exam  Constitutional: He is oriented to person, place, and time and well-developed, well-nourished, and in no distress.  HENT:  Head: Normocephalic and atraumatic.  Eyes: Conjunctivae and EOM are normal. Pupils are equal, round, and reactive to light.  Neck: Normal range of motion. Neck supple. No tracheal deviation present. No thyromegaly present.  Cardiovascular: Normal rate, regular rhythm and normal heart sounds.   Pulmonary/Chest: Effort normal and breath sounds normal. No respiratory distress. He has no wheezes. He exhibits no tenderness.  Abdominal: Soft. Bowel sounds are normal. He exhibits no distension. There is no  tenderness.  Musculoskeletal: Normal range of motion.  Neurological: He is alert and oriented to person, place, and time. No cranial nerve deficit.  Skin: Skin is warm and dry. No rash noted.  Psychiatric: Mood and affect normal.   LABORATORY PANEL:  Male CBC  Recent Labs Lab 08/28/16 0053  WBC 12.7*  HGB 15.3  HCT 45.0  PLT 144*   ------------------------------------------------------------------------------------------------------------------ Chemistries   Recent Labs Lab 08/27/16 1033 08/28/16 0053  NA 139 135  K 4.1 4.5  CL 105 104  CO2 27 24  GLUCOSE 122* 136*  BUN 16 14  CREATININE 1.04 0.80  CALCIUM 9.0 8.8*  MG  --  1.9  AST 36  --   ALT 56  --   ALKPHOS 77  --   BILITOT 0.7  --    RADIOLOGY:  Dg Chest Port 1 View  Result Date: 08/28/2016 CLINICAL DATA:  Acute respiratory failure EXAM: PORTABLE CHEST 1 VIEW COMPARISON:  08/27/2016 FINDINGS: Cardiac shadow is at the upper limits of normal in size. The lungs are well aerated with persistent right middle lobe infiltrate. No new focal infiltrate is seen. No sizable effusion is noted. No bony abnormality is seen. IMPRESSION: Stable right middle lobe infiltrate. Electronically Signed   By: Inez Catalina M.D.   On: 08/28/2016 07:29   ASSESSMENT AND PLAN:  Randall Lester  is a 41 y.o. male with a known history of Hypertension not on any medications consist of the emergency room with history of cough and postnasal drip for 4 days. He denies any fever or chest pain was found to have increasing shortness of breath which got worse this morning and came to the emergency room with hypoxia sats in the 80s.  Workup in the ER showed patient is tachycardic tachypneic elevated white count and chest x-ray significant for right middle lobe pneumonia.  1. Acute hypoxic respiratory failure secondary to right middle lobe community acquired pneumonia and acute PE - continue IV Rocephin and Zithromax -Follow blood cultures sputum  culture -Incentive spirometer -When necessary nebs -CT chest shows acute PE.  - Patient has history of DVT in the remote past but not on any oral anticoagulation - wean O2 as tolerated  2. Sepsis secondary to right middle lobe pneumonia -Present on admission -IV antibiotics as above  3. Acute PE: due to LE DVT - started on Eliquis  4. Acute left lower extremity femoral popliteal DVT - started on eliquis  5. Hyperlipidemia continue omega-3 fatty acid       All the records are reviewed and case discussed with Care Management/Social Worker. Management plans discussed with the patient, family and they are in agreement.  CODE STATUS: Full Code  TOTAL TIME TAKING CARE OF THIS PATIENT: 35 minutes.   More than 50% of the time was spent in counseling/coordination of care: YES  POSSIBLE D/C IN 1-2 DAYS, DEPENDING ON CLINICAL CONDITION.   Max Sane M.D on 08/28/2016 at 5:03 PM  Between 7am to 6pm - Pager - [phone]  After 6pm go to [email or web address] - Proofreader  [facility]  Office  [phone]  CC: Primary care physician; Valera Castle, MD  Note: This dictation was prepared with Dragon dictation along with smaller phrase technology. Any transcriptional errors that result from this process are unintentional.

## 2016-08-28 NOTE — Consult Note (Signed)
[facility] Consultation   ASSESSMENT/PLAN   Bilateral submassive pulmonary emboli patient with bilateral pulmonary emboli. Prior history of left-sided DVT. Recent anterior cruciate ligament repair. Hemodynamics are stable with blood pressure 170. Although does have evidence of right ventricular strain would not give thrombolytics at this point unless patient becomes hemodynamically unstable.   Oxygen saturation is 97% on 4LNC Ultrasound of lower extremities reveals left lower leg DVT  Pneumonia. CT scan does reveal evidence of parenchymal infiltrates in the middle and superior segment of the right lower lobe. He does have some evidence of congestion, chills and mild leukocytosis agree with azithromycin and Rocephin empirically  Positive troponin at 0.2. EKG reveals sinus tachycardia without any acute ST segment changes. This most likely is reflective of supply demand ischemia and right ventricular overload    Mild leukocytosis. Started on broad-spectrum anabiotic coverage   Overall history status has improved Consider transferring to the general medical floor in the next 12 hours  INTAKE / OUTPUT:  Intake/Output Summary (Last 24 hours) at 08/28/16 0753 Last data filed at 08/28/16 0700  Gross per 24 hour  Intake          1996.45 ml  Output             1850 ml  Net           146.45 ml    Name: Randall Lester MRN: [id number] DOB: 1975-03-14    ADMISSION DATE:  08/27/2016 CONSULTATION DATE:  08/27/2016  REFERRING MD :  Hospitalist service  CHIEF COMPLAINT:  Shortness of breath   HISTORY OF PRESENT ILLNESS:   No acute distress No fevers no chills Hypoxia is slowly resolving Heart rate is in the 80s Blood pressure stable Patient still short of breath but much improved   REVIEW OF SYSTEMS:   Please see history of present illness for pertinent positives The remainder of systems were reviewed and were found to be negative other than what is  documented in the HPI.    VITAL SIGNS: Temp:  [97.9 F (36.6 C)-100.2 F (37.9 C)] 98.1 F (36.7 C) (06/25 0400) Pulse Rate:  [77-135] 77 (06/25 0700) Resp:  [12-36] 14 (06/25 0700) BP: (122-170)/(91-124) 127/94 (06/25 0700) SpO2:  [72 %-97 %] 97 % (06/25 0700) Weight:  [260 lb (117.9 kg)-267 lb 6.7 oz (121.3 kg)] 267 lb 6.7 oz (121.3 kg) (06/24 1300)   INTAKE / OUTPUT:  Intake/Output Summary (Last 24 hours) at 08/28/16 0753 Last data filed at 08/28/16 0700  Gross per 24 hour  Intake          1996.45 ml  Output             1850 ml  Net           146.45 ml    Physical Examination:   VS: BP (!) 127/94   Pulse 77   Temp 98.1 F (36.7 C) (Oral)   Resp 14   Ht 6' 1"  (1.854 m)   Wt 267 lb 6.7 oz (121.3 kg)   SpO2 97%   BMI 35.28 kg/m   General Appearance: awake, alert Neuro:without focal findings, mental status, speech normal,. HEENT: PERRLA, EOM intact, no ptosis, no other lesions noticed;  Pulmonary: normal breath sounds., diaphragmatic excursion normal, accessory muscle utilization noted, essentially clear to auscultation CardiovascularNormal S1,S2.  No m/r/g.    Abdomen: Benign, Soft, non-tender, No masses, hepatosplenomegaly, No lymphadenopathy Renal:  No costovertebral tenderness  Skin:   warm, no rashes, no ecchymosis  Extremities: Asymmetric left lower extremity swelling   LABS: Reviewed   LABORATORY PANEL:   CBC  Recent Labs Lab 08/28/16 0053  WBC 12.7*  HGB 15.3  HCT 45.0  PLT 144*    Chemistries   Recent Labs Lab 08/27/16 1033 08/28/16 0053  NA 139 135  K 4.1 4.5  CL 105 104  CO2 27 24  GLUCOSE 122* 136*  BUN 16 14  CREATININE 1.04 0.80  CALCIUM 9.0 8.8*  MG  --  1.9  PHOS  --  3.2  AST 36  --   ALT 56  --   ALKPHOS 77  --   BILITOT 0.7  --      Recent Labs Lab 08/27/16 1300  GLUCAP 136*   No results for input(s): PHART, PCO2ART, PO2ART in the last 168 hours.  Recent Labs Lab 08/27/16 1033  AST 36  ALT 56  ALKPHOS  77  BILITOT 0.7  ALBUMIN 3.7    Cardiac Enzymes  Recent Labs Lab 08/27/16 1033  TROPONINI 0.20*      Patient satisfied with Plan of action and management. All questions answered  Corrin Parker, M.D.  [facility]  Medical Director [place] Director [facility] Cardio-Pulmonary Department

## 2016-08-28 NOTE — Progress Notes (Addendum)
Dr Mortimer Fries notified of elevated BP, give hydralazine x 1 and he will discuss w/ RPt

## 2016-08-28 NOTE — Care Management Note (Addendum)
Case Management Note  Patient Details  Name: Randall Lester MRN: [id number] Date of Birth: 05/30/1975  Subjective/Objective:                  Met with patient to discuss discharge planning. He requests follow up appointment made on Wednesday to his PCP at [facility]. He also requests assistance with new medications Eliquis at [facility] [phone]. Per verbal order from Dr. Max Sane: Eliquis 60m PO BID for 7 days and then 540mPO BID for 3 months and follow up with PCP. [employer] [place] Action/Plan: Pharmacy will fax prior auth form to this RNCM (received) and I will start this process: Approved for ID [id number]: 07/29/16-08/28/2017. Cost to patient: $150.00 free 30-day coupon delivered to patient.  Follow up appointment scheduled with Dr. OlKym [phone] date/time: 09/13/16 10:45AM. Patient informed and agrees.      Expected Discharge Date:                  Expected Discharge Plan:     In-House Referral:     Discharge planning Services  CM Consult, Medication Assistance  Post Acute Care Choice:    Choice offered to:  Patient  DME Arranged:    DME Agency:     HH Arranged:    HHDicksongency:     Status of Service:  Completed, signed off  If discussed at [facility] Meetings, dates discussed:    Additional Comments:  AnMarshell GarfinkelRN 08/28/2016, 11:26 AM

## 2016-08-29 ENCOUNTER — Inpatient Hospital Stay: Payer: Managed Care, Other (non HMO)

## 2016-08-29 DIAGNOSIS — I82409 Acute embolism and thrombosis of unspecified deep veins of unspecified lower extremity: Secondary | ICD-10-CM

## 2016-08-29 DIAGNOSIS — R51 Headache: Secondary | ICD-10-CM

## 2016-08-29 DIAGNOSIS — R0602 Shortness of breath: Secondary | ICD-10-CM

## 2016-08-29 DIAGNOSIS — I2699 Other pulmonary embolism without acute cor pulmonale: Secondary | ICD-10-CM

## 2016-08-29 LAB — BASIC METABOLIC PANEL
Anion gap: 6 (ref 5–15)
BUN: 19 mg/dL (ref 6–20)
CHLORIDE: 106 mmol/L (ref 101–111)
CO2: 26 mmol/L (ref 22–32)
CREATININE: 0.95 mg/dL (ref 0.61–1.24)
Calcium: 8.6 mg/dL — ABNORMAL LOW (ref 8.9–10.3)
GFR calc Af Amer: >60 mL/min (ref >60)
GFR calc non Af Amer: >60 mL/min (ref >60)
GLUCOSE: 100 mg/dL — AB (ref 65–99)
Potassium: 4 mmol/L (ref 3.5–5.1)
Sodium: 138 mmol/L (ref 135–145)

## 2016-08-29 LAB — CBC
HCT: 42.5 % (ref 40.0–52.0)
Hemoglobin: 14.4 g/dL (ref 13.0–18.0)
MCH: 28.8 pg (ref 26.0–34.0)
MCHC: 33.8 g/dL (ref 32.0–36.0)
MCV: 85.3 fL (ref 80.0–100.0)
Platelets: 148 10*3/uL — ABNORMAL LOW (ref 150–440)
RBC: 4.98 MIL/uL (ref 4.40–5.90)
RDW: 14.7 % — ABNORMAL HIGH (ref 11.5–14.5)
WBC: 9 10*3/uL (ref 3.8–10.6)

## 2016-08-29 MED ORDER — SALINE SPRAY 0.65 % NA SOLN
1.0000 | NASAL | Status: DC | PRN
  Administered 2016-08-29: 14:00:00 1 via NASAL
  Filled 2016-08-29: qty 44

## 2016-08-29 MED ORDER — APIXABAN 5 MG PO TABS: 10.0000 mg | ORAL_TABLET | Freq: Two times a day (BID) | ORAL | 0 refills | Status: DC

## 2016-08-29 MED ORDER — APIXABAN 5 MG PO TABS: 5.0000 mg | ORAL_TABLET | Freq: Two times a day (BID) | ORAL | 0 refills | Status: DC

## 2016-08-29 MED ORDER — AMOXICILLIN-POT CLAVULANATE 875-125 MG PO TABS: 1.0000 | ORAL_TABLET | Freq: Two times a day (BID) | ORAL | 0 refills | Status: DC

## 2016-08-29 NOTE — Plan of Care (Signed)
Problem: Pain Managment: Goal: General experience of comfort will improve Outcome: Progressing Pt requiring tylenol for headache x2

## 2016-08-29 NOTE — Discharge Summary (Signed)
Randall Lester NAME: Randall Lester    MR#:  [id number]  DATE OF BIRTH:  12-19-1975  DATE OF ADMISSION:  08/27/2016   ADMITTING PHYSICIAN: Fritzi Mandes, MD  DATE OF DISCHARGE: 08/29/2016  7:30 PM  PRIMARY CARE PHYSICIAN: Valera Castle, MD   ADMISSION DIAGNOSIS:  Pulmonary infarct ([place]) [I26.99] Leg swelling [M79.89] Hypoxia [R09.02] Elevated troponin [R74.8] Bilateral pulmonary embolism ([place]) [I26.99] Community acquired pneumonia of left lower lobe of lung ([place]) [J18.1] Acute respiratory failure with hypoxia ([place]) [J96.01] DISCHARGE DIAGNOSIS:  Active Problems:   Sepsis ([place])   Acute respiratory failure with hypoxia ([place])  SECONDARY DIAGNOSIS:   Past Medical History:  Diagnosis Date  . DVT (deep venous thrombosis) ([place])   . Hypertension    HOSPITAL COURSE:  Randall Lester a 41 y.o. malewith a known history of Hypertension not on any medications consist of the emergency room with history of cough and postnasal drip for 4 days. He denies any fever or chest pain was found to have increasing shortness of breath which got worse along with hypoxia sats in the 80s. Workup in the ER showed right middle lobe pneumonia for which he was admitted.  * Acute hypoxic respiratory failure secondary to right middle lobe community acquired pneumonia and acute PE - improving with Abx and full dose anticoagulation with Eliquis - not a candidate for IVC filter or thrombolytics per vascular surgery - outpt f/up with [name] for DVT work up and long term NOAC mgmt  * headache: MRI brain neg for any acute patho - likely from dry air from n.c. O2.  - now resolved  * Sepsis: present on admission secondary to right middle lobe pneumonia resolved  * Acute PE: due to LE DVT - continue Eliquis  * Acute left lower extremity femoral popliteal DVT - continue eliquis   DISCHARGE CONDITIONS:  stable CONSULTS  OBTAINED:  Treatment Team:  Algernon Huxley, MD Schnier, Dolores Lory, MD DRUG ALLERGIES:  No Known Allergies DISCHARGE MEDICATIONS:   Allergies as of 08/29/2016   No Known Allergies     Medication List    TAKE these medications   amoxicillin-clavulanate 875-125 MG tablet Commonly known as:  AUGMENTIN Take 1 tablet by mouth 2 (two) times daily.   apixaban 5 MG Tabs tablet Commonly known as:  ELIQUIS Take 2 tablets (10 mg total) by mouth 2 (two) times daily. 10 mg PO bid for 5 more days followed by 5 mg PO bid for 6 months   apixaban 5 MG Tabs tablet Commonly known as:  ELIQUIS Take 1 tablet (5 mg total) by mouth 2 (two) times daily. 10 mg PO bid for 5 more days followed by 5 mg PO bid for 6 months Start taking on:  09/04/2016   diphenhydrAMINE 25 MG tablet Commonly known as:  BENADRYL Take 25 mg by mouth every 6 (six) hours as needed.   guaiFENesin 600 MG 12 hr tablet Commonly known as:  MUCINEX Take 1,200 mg by mouth 2 (two) times daily as needed.   multivitamin tablet Take 1 tablet by mouth daily.   omega-3 acid ethyl esters 1 g capsule Commonly known as:  LOVAZA Take 1 g by mouth 2 (two) times daily.        DISCHARGE INSTRUCTIONS:   DIET:  Regular diet DISCHARGE CONDITION:  Good ACTIVITY:  Activity as tolerated OXYGEN:  Home Oxygen: No.  Oxygen Delivery: room air DISCHARGE LOCATION:  home  If you experience worsening of your admission symptoms, develop shortness of breath, life threatening emergency, suicidal or homicidal thoughts you must seek medical attention immediately by calling 911 or calling your MD immediately  if symptoms less severe.  You Must read complete instructions/literature along with all the possible adverse reactions/side effects for all the Medicines you take and that have been prescribed to you. Take any new Medicines after you have completely understood and accpet all the possible adverse reactions/side effects.   Please note  You  were cared for by a hospitalist during your hospital stay. If you have any questions about your discharge medications or the care you received while you were in the hospital after you are discharged, you can call the unit and asked to speak with the hospitalist on call if the hospitalist that took care of you is not available. Once you are discharged, your primary care physician will handle any further medical issues. Please note that NO REFILLS for any discharge medications will be authorized once you are discharged, as it is imperative that you return to your primary care physician (or establish a relationship with a primary care physician if you do not have one) for your aftercare needs so that they can reassess your need for medications and monitor your lab values.    On the day of Discharge:  VITAL SIGNS:  Blood pressure 140/90, pulse (!) 104, temperature 97.5 F (36.4 C), temperature source Oral, resp. rate 14, height 6' 1"  (1.854 m), weight 121.3 kg (267 lb 6.4 oz), SpO2 96 %. PHYSICAL EXAMINATION:  GENERAL:  41 y.o.-year-old patient lying in the bed with no acute distress.  EYES: Pupils equal, round, reactive to light and accommodation. No scleral icterus. Extraocular muscles intact.  HEENT: Head atraumatic, normocephalic. Oropharynx and nasopharynx clear.  NECK:  Supple, no jugular venous distention. No thyroid enlargement, no tenderness.  LUNGS: Normal breath sounds bilaterally, no wheezing, rales,rhonchi or crepitation. No use of accessory muscles of respiration.  CARDIOVASCULAR: S1, S2 normal. No murmurs, rubs, or gallops.  ABDOMEN: Soft, non-tender, non-distended. Bowel sounds present. No organomegaly or mass.  EXTREMITIES: No pedal edema, cyanosis, or clubbing.  NEUROLOGIC: Cranial nerves II through XII are intact. Muscle strength 5/5 in all extremities. Sensation intact. Gait not checked.  PSYCHIATRIC: The patient is alert and oriented x 3.  SKIN: No obvious rash, lesion, or ulcer.    DATA REVIEW:   CBC  Recent Labs Lab 08/29/16 0513  WBC 9.0  HGB 14.4  HCT 42.5  PLT 148*    Chemistries   Recent Labs Lab 08/27/16 1033 08/28/16 0053 08/29/16 0513  NA 139 135 138  K 4.1 4.5 4.0  CL 105 104 106  CO2 27 24 26   GLUCOSE 122* 136* 100*  BUN 16 14 19   CREATININE 1.04 0.80 0.95  CALCIUM 9.0 8.8* 8.6*  MG  --  1.9  --   AST 36  --   --   ALT 56  --   --   ALKPHOS 77  --   --   BILITOT 0.7  --   --      Microbiology Results    RADIOLOGY:  Mr Brain 43 Contrast  Result Date: 08/29/2016 CLINICAL DATA:  Headache EXAM: MRI HEAD WITHOUT CONTRAST TECHNIQUE: Multiplanar, multiecho pulse sequences of the brain and surrounding structures were obtained without intravenous contrast. COMPARISON:  CT head 09/30/2013 FINDINGS: Brain: Ventricle size and cerebral volume normal. Negative for acute infarct. Scattered small white matter hyperintensities in  the frontal lobe bilaterally. Remaining white matter normal. Brainstem normal. Negative for hemorrhage or mass or edema. Vascular: Normal arterial flow voids. Skull and upper cervical spine: Negative Sinuses/Orbits: Mild mucosal edema paranasal sinuses.  Normal orbit Other: None IMPRESSION: No acute abnormality. Small hyperintensities in the frontal white matter bilaterally appear chronic. These are nonspecific and could be seen with chronic microvascular ischemia, migraine headaches, chronic white matter inflammatory disorders. Demyelinating disease not considered likely given the pattern Electronically Signed   By: Franchot Gallo M.D.   On: 08/29/2016 14:23     Management plans discussed with the patient, family and they are in agreement.  CODE STATUS: Full Code   TOTAL TIME TAKING CARE OF THIS PATIENT: 45 minutes.    Max Sane M.D on 08/29/2016 at 10:47 PM  Between 7am to 6pm - Pager - [phone]  After 6pm go to [email or web address] - Proofreader  [facility]  Office   [phone]  CC: Primary care physician; Valera Castle, MD   Note: This dictation was prepared with Dragon dictation along with smaller phrase technology. Any transcriptional errors that result from this process are unintentional.

## 2016-08-29 NOTE — Progress Notes (Signed)
[place] [place] NAME: Tramaine Sauls    MR#:  [id number]  DATE OF BIRTH:  [birth year]  SUBJECTIVE:  CHIEF COMPLAINT:   Chief Complaint  Patient presents with  . Shortness of Breath  O2 requirement went up, having some localized headache and worried about head bleed (him and family), also waiting for vascular input (they want to discuss need for filter) REVIEW OF SYSTEMS:  Review of Systems  Constitutional: Negative for chills, fever and weight loss.  HENT: Negative for nosebleeds and sore throat.   Eyes: Negative for blurred vision.  Respiratory: Positive for shortness of breath. Negative for cough and wheezing.   Cardiovascular: Negative for chest pain, orthopnea, leg swelling and PND.  Gastrointestinal: Negative for abdominal pain, constipation, diarrhea, heartburn, nausea and vomiting.  Genitourinary: Negative for dysuria and urgency.  Musculoskeletal: Negative for back pain.  Skin: Negative for rash.  Neurological: Positive for headaches. Negative for dizziness, speech change and focal weakness.  Endo/Heme/Allergies: Does not bruise/bleed easily.  Psychiatric/Behavioral: Negative for depression.   DRUG ALLERGIES:  No Known Allergies VITALS:  Blood pressure 140/90, pulse 93, temperature 97.5 F (36.4 C), temperature source Oral, resp. rate 14, height 6' 1"  (1.854 m), weight 121.3 kg (267 lb 6.4 oz), SpO2 96 %. PHYSICAL EXAMINATION:  Physical Exam  Constitutional: He is oriented to person, place, and time and well-developed, well-nourished, and in no distress.  HENT:  Head: Normocephalic and atraumatic.  Eyes: Conjunctivae and EOM are normal. Pupils are equal, round, and reactive to light.  Neck: Normal range of motion. Neck supple. No tracheal deviation present. No thyromegaly present.  Cardiovascular: Normal rate, regular rhythm and normal heart sounds.   Pulmonary/Chest: Effort normal and breath sounds normal. No  respiratory distress. He has no wheezes. He exhibits no tenderness.  Abdominal: Soft. Bowel sounds are normal. He exhibits no distension. There is no tenderness.  Musculoskeletal: Normal range of motion.  Neurological: He is alert and oriented to person, place, and time. No cranial nerve deficit.  Skin: Skin is warm and dry. No rash noted.  Psychiatric: Mood and affect normal.   LABORATORY PANEL:  Male CBC  Recent Labs Lab 08/29/16 0513  WBC 9.0  HGB 14.4  HCT 42.5  PLT 148*   ------------------------------------------------------------------------------------------------------------------ Chemistries   Recent Labs Lab 08/27/16 1033 08/28/16 0053 08/29/16 0513  NA 139 135 138  K 4.1 4.5 4.0  CL 105 104 106  CO2 27 24 26   GLUCOSE 122* 136* 100*  BUN 16 14 19   CREATININE 1.04 0.80 0.95  CALCIUM 9.0 8.8* 8.6*  MG  --  1.9  --   AST 36  --   --   ALT 56  --   --   ALKPHOS 77  --   --   BILITOT 0.7  --   --    RADIOLOGY:  No results found. ASSESSMENT AND PLAN:  Alroy Portela  is a 40 y.o. male with a known history of Hypertension not on any medications consist of the emergency room with history of cough and postnasal drip for 4 days. He denies any fever or chest pain was found to have increasing shortness of breath which got worse this morning and came to the emergency room with hypoxia sats in the 80s. Workup in the ER showed patient is tachycardic tachypneic elevated white count and chest x-ray significant for right middle lobe pneumonia.  1. Acute hypoxic respiratory failure secondary to right middle lobe  community acquired pneumonia and acute PE - continue IV Rocephin and Zithromax -Follow blood cultures sputum culture -Incentive spirometer -When necessary nebs - CT chest shows acute PE.  - Patient has history of DVT in the remote past but not on any oral anticoagulation - wean O2 as tolerated - vascular surgery c/s  * headache: will get MRI brain as they  are concern about small head bleed  2. Sepsis secondary to right middle lobe pneumonia -IV antibiotics as above  3. Acute PE: due to LE DVT - continue Eliquis  4. Acute left lower extremity femoral popliteal DVT - continue eliquis  5. Hyperlipidemia continue omega-3 fatty acid     All the records are reviewed and case discussed with Care Management/Social Worker. Management plans discussed with the patient, family and they are in agreement.  CODE STATUS: Full Code  TOTAL TIME TAKING CARE OF THIS PATIENT: 35 minutes.   More than 50% of the time was spent in counseling/coordination of care: YES  POSSIBLE D/C IN 1-2 DAYS, DEPENDING ON CLINICAL CONDITION.   Max Sane M.D on 08/29/2016 at 10:54 AM  Between 7am to 6pm - Pager - [phone]  After 6pm go to [email or web address] - Proofreader  [facility]  Office  [phone]  CC: Primary care physician; Valera Castle, MD  Note: This dictation was prepared with Dragon dictation along with smaller phrase technology. Any transcriptional errors that result from this process are unintentional.

## 2016-08-29 NOTE — Discharge Instructions (Signed)
Pulmonary Embolism A pulmonary embolism (PE) is a sudden blockage or decrease of blood flow in one lung or both lungs. Most blockages come from a blood clot that travels from the legs or the pelvis to the lungs. PE is a dangerous and potentially life-threatening condition if it is not treated right away. What are the causes? A pulmonary embolism occurs most commonly when a blood clot travels from one of your veins to your lungs. Rarely, PE is caused by air, fat, amniotic fluid, or part of a tumor traveling through your veins to your lungs. What increases the risk? A PE is more likely to develop in:  People who smoke.  People who areolder, especially over 56 years of age.  People who are overweight (obese).  People who sit or lie still for a long time, such as during long-distance travel (over 4 hours), bed rest, hospitalization, or during recovery from certain medical conditions like a stroke.  People who do not engage in much physical activity (sedentary lifestyle).  People who have chronic breathing disorders.  People whohave a personal or family history of blood clots or blood clotting disease.  People whohave peripheral vascular disease (PVD), diabetes, or some types of cancer.  People who haveheart disease, especially if the person had a recent heart attack or has congestive heart failure.  People who have neurological diseases that affect the legs (leg paresis).  People who have had a traumatic injury, such as breaking a hip or leg.  People whohave recently had major or lengthy surgery, especially on the hip, knee, or abdomen.  People who have hada central line placed inside a large vein.  People who takemedicines that contain the hormone estrogen. These include birth control pills and hormone replacement therapy.  Pregnancy or during childbirth or the postpartum period.  What are the signs or symptoms? The symptoms of a PE usually start suddenly and  include:  Shortness of breath while active or at rest.  Coughing or coughing up blood or blood-tinged mucus.  Chest pain that is often worse with deep breaths.  Rapid or irregular heartbeat.  Feeling light-headed or dizzy.  Fainting.  Feelinganxious.  Sweating.  There may also be pain and swelling in a leg if that is where the blood clot started. These symptoms may represent a serious problem that is an emergency. Do not wait to see if the symptoms will go away. Get medical help right away. Call your local emergency services (911 in the [place] Do not drive yourself to the hospital. How is this diagnosed? Your health care provider will take a medical history and perform a physical exam. You may also have other tests, including:  Blood tests to assess the clotting properties of your blood, assess oxygen levels in your blood, and find blood clots.  Imaging tests, such as CT, ultrasound, MRI, X-ray, and other tests to see if you have clots anywhere in your body.  An electrocardiogram (ECG) to look for heart strain from blood clots in the lungs.  How is this treated? The main goals of PE treatment are:  To stop a blood clot from growing larger.  To stop new blood clots from forming.  The type of treatment that you receive depends on many factors, such as the cause of your PE, your risk for bleeding or developing more clots, and other medical conditions that you have. Sometimes, a combination of treatments is necessary. This condition may be treated with:  Medicines, including newer oral blood thinners (  anticoagulants), warfarin, low molecular weight heparins, thrombolytics, or heparins.  Wearing compression stockings or using different types of devices.  Surgery (rare) to remove the blood clot or to place a filter in your abdomen to stop the blood clot from traveling to your lungs.  Treatments for a PE are often divided into immediate treatment, long-term treatment (up to 3  months after PE), and extended treatment (more than 3 months after PE). Your treatment may continue for several months. This is called maintenance therapy, and it is used to prevent the forming of new blood clots. You can work with your health care provider to choose the treatment program that is best for you. What are anticoagulants? Anticoagulants are medicines that treat PEs. They can stop current blood clots from growing and stop new clots from forming. They cannot dissolve existing clots. Your body dissolves clots by itself over time. Anticoagulants are given by mouth, by injection, or through an IV tube. What are thrombolytics? Thrombolytics are clot-dissolving medicines that are used to dissolve a PE. They carry a high risk of bleeding, so they tend to be used only in severe cases or if you have very low blood pressure. Follow these instructions at home: If you are taking a newer oral anticoagulant:  Take the medicine every single day at the same time each day.  Understand what foods and drugs interact with this medicine.  Understand that there are no regular blood tests required when using this medicine.  Understandthe side effects of this medicine, including excessive bruising or bleeding. Ask your health care provider or pharmacist about other possible side effects. If you are taking warfarin:  Understand how to take warfarin and know which foods can affect how warfarin works in Veterinary surgeon.  Understand that it is dangerous to taketoo much or too little warfarin. Too much warfarin increases the risk of bleeding. Too little warfarin continues to allow the risk for blood clots.  Follow your PT and INR blood testing schedule. The PT and INR results allow your health care provider to adjust your dose of warfarin. It is very important that you have your PT and INR tested as often as told by your health care provider.  Avoid major changes in your diet, or tell your health care provider  before you change your diet. Arrange a visit with a registered dietitian to answer your questions. Many foods, especially foods that are high in vitamin K, can interfere with warfarin and affect the PT and INR results. Eat a consistent amount of foods that are high in vitamin K, such as: ? Spinach, kale, broccoli, cabbage, collard greens, turnip greens, Brussels sprouts, peas, cauliflower, seaweed, and parsley. ? Beef liver and pork liver. ? Green tea. ? Soybean oil.  Tell your health care provider about any and all medicines, vitamins, and supplements that you take, including aspirin and other over-the-counter anti-inflammatory medicines. Be especially cautious with aspirin and anti-inflammatory medicines. Do not take those before you ask your health care provider if it is safe to do so. This is important because many medicines can interfere with warfarin and affect the PT and INR results.  Do not start or stop taking any over-the-counter or prescription medicine unless your health care provider or pharmacist tells you to do so. If you take warfarin, you will also need to do these things:  Hold pressure over cuts for longer than usual.  Tell your dentist and other health care providers that you are taking warfarin before you have  any procedures in which bleeding may occur.  Avoid alcohol or drink very small amounts. Tell your health care provider if you change your alcohol intake.  Do not use tobacco products, including cigarettes, chewing tobacco, and e-cigarettes. If you need help quitting, ask your health care provider.  Avoid contact sports.  General instructions  Take over-the-counter and prescription medicines only as told by your health care provider. Anticoagulant medicines can have side effects, including easy bruising and difficulty stopping bleeding. If you are prescribed an anticoagulant, you will also need to do these things: ? Hold pressure over cuts for longer than  usual. ? Tell your dentist and other health care providers that you are taking anticoagulants before you have any procedures in which bleeding may occur. ? Avoid contact sports.  Wear a medical alert bracelet or carry a medical alert card that says you have had a PE.  Ask your health care provider how soon you can go back to your normal activities. Stay active to prevent new blood clots from forming.  Make sure to exercise while traveling or when you have been sitting or standing for a long period of time. It is very important to exercise. Exercise your legs by walking or by tightening and relaxing your leg muscles often. Take frequent walks.  Wear compression stockings as told by your health care provider to help prevent more blood clots from forming.  Do not use tobacco products, including cigarettes, chewing tobacco, and e-cigarettes. If you need help quitting, ask your health care provider.  Keep all follow-up appointments with your health care provider. This is important. How is this prevented? Take these actions to decrease your risk of developing another PE:  Exercise regularly. For at least 30 minutes every day, engage in: ? Activity that involves moving your arms and legs. ? Activity that encourages good blood flow through your body by increasing your heart rate.  Exercise your arms and legs every hour during long-distance travel (over 4 hours). Drink plenty of water and avoid drinking alcohol while traveling.  Avoid sitting or lying in bed for long periods of time without moving your legs.  Maintain a weight that is appropriate for your height. Ask your health care provider what weight is healthy for you.  If you are a woman who is over 53 years of age, avoid unnecessary use of medicines that contain estrogen. These include birth control pills.  Do not smoke, especially if you take estrogen medicines. If you need help quitting, ask your health care provider.  If you are at  very high risk for PE, wear compression stockings.  If you recently had a PE, have regularly scheduled ultrasound testing on your legs to check for new blood clots.  If you are hospitalized, prevention measures may include:  Early walking after surgery, as soon as your health care provider says that it is safe.  Receiving anticoagulants to prevent blood clots. If you cannot take anticoagulants, other options may be available, such as wearing compression stockings or using different types of devices.  Get help right away if:  You have new or increased pain, swelling, or redness in an arm or leg.  You have numbness or tingling in an arm or leg.  You have shortness of breath while active or at rest.  You have chest pain.  You have a rapid or irregular heartbeat.  You feel light-headed or dizzy.  You cough up blood.  You notice blood in your vomit, bowel movement, or  urine.  You have a fever. These symptoms may represent a serious problem that is an emergency. Do not wait to see if the symptoms will go away. Get medical help right away. Call your local emergency services (911 in the [place] Do not drive yourself to the hospital. This information is not intended to replace advice given to you by your health care provider. Make sure you discuss any questions you have with your health care provider. Document Released: 02/18/2000 Document Revised: 07/29/2015 Document Reviewed: 06/17/2014 [employer] Interactive Patient Education  2017 [employer].

## 2016-08-29 NOTE — Consult Note (Signed)
[facility] Vascular Consult Note  MRN : [id number]  Randall Lester is a 41 y.o. (January 12, 1976) male who presents with chief complaint of  Chief Complaint  Patient presents with  . Shortness of Breath  .  History of Present Illness: I am asked to evaluate the patient by Dr. Manuella Ghazi.  Dr. Manuella Ghazi contacted me by phone yesterday evening.  The patient is a 41 year old gentleman who presented to [facility] Sunday evening, (approximately 2 days ago), with a history of worsening cough. He estimates the cough began less than 1 week ago. Since his symptoms were getting worse he came to the emergency room where workup showed a right middle lobe pneumonia with O2 saturations in the 80s. This prompted a CT scan which confirmed the right middle lobe pneumonia but also demonstrated a pulmonary emboli.  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 The theory is that he developed an asymptomatic DVT after an anterior cruciate ligament repair 10 years ago. This was not discovered in the postoperative period but years later when the swelling of his left lower extremity became worse and he was evaluated at [facility]. Duplex ultrasound demonstrated chronic deep venous changes and because of this he was not started on any anticoagulation. He has done well since that time until these recent events.  Here at [facility] once the pulmonary embolism was identified duplex ultrasound of the lower extremities was performed and this demonstrated acute with chronic changes in the left thigh and groin area. Subsequently, he was initiated on a heparin drip and then has been transitioned Eliquis.  Today he is feeling better he is breathing comfortably off his oxygen and has been walking around the hospital room as well as to the bathroom without significant shortness of breath. He performed his daily activities in the bathroom this morning  without shortness of breath. He denies pleuritic chest pain. He is complaining of a headache and this is being evaluated with MRI.  Current Facility-Administered Medications  Medication Dose Route Frequency Provider Last Rate Last Dose  . acetaminophen (TYLENOL) tablet 650 mg  650 mg Oral Q6H PRN Fritzi Mandes, MD   650 mg at 08/29/16 0304   Or  . acetaminophen (TYLENOL) suppository 650 mg  650 mg Rectal Q6H PRN Fritzi Mandes, MD      . albuterol (PROVENTIL) (2.5 MG/3ML) 0.083% nebulizer solution 2.5 mg  2.5 mg Nebulization Q2H PRN Fritzi Mandes, MD      . apixaban Arne Cleveland) tablet 10 mg  10 mg Oral BID Flora Lipps, MD   10 mg at 08/29/16 0859   Followed by  . [START ON 09/04/2016] apixaban (ELIQUIS) tablet 5 mg  5 mg Oral BID Flora Lipps, MD      . azithromycin (ZITHROMAX) tablet 250 mg  250 mg Oral Daily Flora Lipps, MD   250 mg at 08/29/16 0859  . cefTRIAXone (ROCEPHIN) 1 g in dextrose 5 % 50 mL IVPB  1 g Intravenous Q24H Flora Lipps, MD   Stopped at 08/28/16 1042  . guaiFENesin (MUCINEX) 12 hr tablet 1,200 mg  1,200 mg Oral BID Fritzi Mandes, MD   1,200 mg at 08/29/16 0858  . MEDLINE mouth rinse  15 mL Mouth Rinse BID Conforti, John, DO   15 mL at 08/29/16 0859  . metoprolol tartrate (LOPRESSOR) injection 5 mg  5 mg Intravenous Q6H PRN Conforti, John, DO   5 mg at 08/27/16 1409  . multivitamin  with minerals tablet 1 tablet  1 tablet Oral Daily Fritzi Mandes, MD   1 tablet at 08/29/16 [phone]  . omega-3 acid ethyl esters (LOVAZA) capsule 1 g  1 g Oral BID Fritzi Mandes, MD   1 g at 08/29/16 0858  . ondansetron (ZOFRAN) tablet 4 mg  4 mg Oral Q6H PRN Fritzi Mandes, MD       Or  . ondansetron [facility]) injection 4 mg  4 mg Intravenous Q6H PRN Fritzi Mandes, MD      . polyethylene glycol (MIRALAX / GLYCOLAX) packet 17 g  17 g Oral Daily PRN Fritzi Mandes, MD      . traMADol Veatrice Bourbon) tablet 50 mg  50 mg Oral Q6H PRN Fritzi Mandes, MD        Past Medical History:  Diagnosis Date  . DVT (deep venous thrombosis)  ([place])   . Hypertension     History reviewed. No pertinent surgical history.  Social History Social History  Substance Use Topics  . Smoking status: Never Smoker  . Smokeless tobacco: Never Used  . Alcohol use Yes    Family History History reviewed. No pertinent family history. No family history of bleeding/clotting disorders, porphyria or autoimmune disease   No Known Allergies   REVIEW OF SYSTEMS (Negative unless checked)  Constitutional: [] Weight loss  [] Fever  [] Chills Cardiac: [] Chest pain   [] Chest pressure   [] Palpitations   [] Shortness of breath when laying flat   [] Shortness of breath at rest   [x] Shortness of breath with exertion. Vascular:  [] Pain in legs with walking   [] Pain in legs at rest   [] Pain in legs when laying flat   [] Claudication   [] Pain in feet when walking  [] Pain in feet at rest  [] Pain in feet when laying flat   [x] History of DVT   [] Phlebitis   [x] Swelling in legs   [] Varicose veins   [] Non-healing ulcers Pulmonary:   [] Uses home oxygen   [] Productive cough   [] Hemoptysis   [] Wheeze  [] COPD   [] Asthma Neurologic:  [] Dizziness  [] Blackouts   [] Seizures   [] History of stroke   [] History of TIA  [] Aphasia   [] Temporary blindness   [] Dysphagia   [] Weakness or numbness in arms   [] Weakness or numbness in legs Musculoskeletal:  [] Arthritis   [x] Joint swelling   [x] Joint pain   [] Low back pain Hematologic:  [] Easy bruising  [] Easy bleeding   [] Hypercoagulable state   [] Anemic  [] Hepatitis Gastrointestinal:  [] Blood in stool   [] Vomiting blood  [] Gastroesophageal reflux/heartburn   [] Difficulty swallowing. Genitourinary:  [] Chronic kidney disease   [] Difficult urination  [] Frequent urination  [] Burning with urination   [] Blood in urine Skin:  [] Rashes   [] Ulcers   [] Wounds Psychological:  [] History of anxiety   []  History of major depression.  Physical Examination  Vitals:   08/28/16 1938 08/29/16 0345 08/29/16 0419 08/29/16 0856  BP: (!) 147/98 140/82   140/90  Pulse: (!) 102 85  93  Resp: 14     Temp: 97.8 F (36.6 C) 97.5 F (36.4 C)    TempSrc: Oral Oral    SpO2: 95% (!) 88% 93% 96%  Weight:      Height:       Body mass index is 35.28 kg/m. Gen:  WD/WN, NAD Head: [place]/AT, No temporalis wasting. Prominent temp pulse not noted. Ear/Nose/Throat: Hearing grossly intact, nares w/o erythema or drainage, oropharynx w/o Erythema/Exudate Eyes: Sclera non-icteric, conjunctiva clear Neck: Trachea midline.  No JVD.  Pulmonary:  Good air movement, respirations not labored, equal bilaterally.  Cardiac: RRR, normal S1, S2. Gastrointestinal: soft, non-tender/non-distended. No guarding/reflex.  Musculoskeletal: The left lower extremity is significantly larger with severe edema compared to the right. There are mild venous changes at the ankle. M/S 5/5 throughout.  Extremities without ischemic changes.  No deformity or atrophy. Neurologic: Sensation grossly intact in extremities.  Symmetrical.  Speech is fluent. Motor exam as listed above. Psychiatric: Judgment intact, Mood & affect appropriate for pt's clinical situation. Dermatologic: No rashes or ulcers noted.  No cellulitis or open wounds. Tattoo right arm  CBC Lab Results  Component Value Date   WBC 9.0 08/29/2016   HGB 14.4 08/29/2016   HCT 42.5 08/29/2016   MCV 85.3 08/29/2016   PLT 148 (L) 08/29/2016    BMET    Component Value Date/Time   NA 138 08/29/2016 0513   K 4.0 08/29/2016 0513   CL 106 08/29/2016 0513   CO2 26 08/29/2016 0513   GLUCOSE 100 (H) 08/29/2016 0513   BUN 19 08/29/2016 0513   CREATININE 0.95 08/29/2016 0513   CALCIUM 8.6 (L) 08/29/2016 0513   GFRNONAA >60 08/29/2016 0513   GFRAA >60 08/29/2016 0513   Estimated Creatinine Clearance: 138.3 mL/min (by C-G formula based on SCr of 0.95 mg/dL).  COAG Lab Results  Component Value Date   INR 1.07 08/27/2016    Radiology Ct Angio Chest Pe W/cm &/or Wo Cm  Result Date: 08/27/2016 CLINICAL DATA:  Shortness of  breath for 4 days, productive cough. History of DVT and left leg swelling. Decreased O2 sats. EXAM: CT ANGIOGRAPHY CHEST WITH CONTRAST TECHNIQUE: Multidetector CT imaging of the chest was performed using the standard protocol during bolus administration of intravenous contrast. Multiplanar CT image reconstructions and MIPs were obtained to evaluate the vascular anatomy. CONTRAST:  75 cc Isovue 370 COMPARISON:  None. FINDINGS: Cardiovascular: Moderate to large pulmonary emboli are seen within the distal left main pulmonary artery extending into the left interlobar pulmonary artery and multiple segmental pulmonary artery branches to the left upper lobe, lingula and left lower lobe. Additional moderate load of pulmonary embolus is seen within the right interlobar pulmonary artery extending into multiple segmental pulmonary artery branches to the right upper lobe, right middle lobe and right lower lobe. The right ventricle is distended indicating some degree of right heart strain. Overall heart size is normal. No pericardial effusion. Thoracic aorta is normal in caliber and configuration. Mediastinum/Nodes: No mass or enlarged lymph nodes within the mediastinum or perihilar regions. Small lymph nodes in the AP window region of the mediastinum. Normal residual thymic tissue in the anterior mediastinum. Lungs/Pleura: Dense consolidations within the right middle lobe and within the superior segment of the right lower lobe. Additional patchy ground-glass opacities within the right lower lobe and right middle lobe, most likely edema. Milder edema within the left lower lobe. Upper Abdomen: No acute findings. Musculoskeletal: No acute or significant osseous finding. Review of the MIP images confirms the above findings. IMPRESSION: 1. Positive for acute PE with CT evidence of right heart strain (RV/LV Ratio = nearly 2) consistent with at least submassive (intermediate risk) PE. The presence of right heart strain has been  associated with an increased risk of morbidity and mortality. Please activate Code PE by paging [phone]. Bilateral pulmonary emboli, as detailed above. 2. Dense consolidations within the right middle lobe and superior segment of the right lower lobe, likely multifocal pneumonia. 3. Additional patchy ground-glass opacities within the right middle lobe and  right lower lobe, and to a lesser degree in the left lower lobe, most likely pulmonary edema, some components likely sequela of pulmonary infarct. These results were called by telephone at the time of interpretation on 08/27/2016 at 11:55 am to Dr. Lisa Roca , who verbally acknowledged these results. Electronically Signed   By: Franki Cabot M.D.   On: 08/27/2016 12:04   US Venous Img Lower Bilateral  Result Date: 08/27/2016 CLINICAL DATA:  Acute pulmonary emboli EXAM: BILATERAL LOWER EXTREMITY VENOUS DOPPLER ULTRASOUND TECHNIQUE: Gray-scale sonography with graded compression, as well as color Doppler and duplex ultrasound were performed to evaluate the lower extremity deep venous systems from the level of the common femoral vein and including the common femoral, femoral, profunda femoral, popliteal and calf veins including the posterior tibial, peroneal and gastrocnemius veins when visible. The superficial great saphenous vein was also interrogated. Spectral Doppler was utilized to evaluate flow at rest and with distal augmentation maneuvers in the common femoral, femoral and popliteal veins. COMPARISON:  None. FINDINGS: RIGHT LOWER EXTREMITY Common Femoral Vein: No evidence of thrombus. Normal compressibility, respiratory phasicity and response to augmentation. Saphenofemoral Junction: No evidence of thrombus. Normal compressibility and flow on color Doppler imaging. Profunda Femoral Vein: No evidence of thrombus. Normal compressibility and flow on color Doppler imaging. Femoral Vein: No evidence of thrombus. Normal compressibility, respiratory  phasicity and response to augmentation. Popliteal Vein: No evidence of thrombus. Normal compressibility, respiratory phasicity and response to augmentation. Calf Veins: No evidence of thrombus. Normal compressibility and flow on color Doppler imaging. Superficial Great Saphenous Vein: No evidence of thrombus. Normal compressibility and flow on color Doppler imaging. Venous Reflux:  None. Other Findings:  None. LEFT LOWER EXTREMITY Common Femoral Vein: Acute occlusive thrombus. Vessel is noncompressible. Saphenofemoral Junction: Femoral thrombus extends into the saphenous femoral junction. Profunda Femoral Vein: Acute thrombus, nearly occlusive. Vessel is noncompressible. Femoral Vein: Diffuse acute thrombus throughout, nearly occlusive. Vessel is noncompressible. Popliteal Vein: Acute thrombus, nearly occlusive. Vessel is noncompressible. Calf Veins: Very limited assessment of the calf tibial and peroneal veins for clot extension. Superficial Great Saphenous Vein: No evidence of thrombus. Normal compressibility and flow on color Doppler imaging. Venous Reflux:  None. Other Findings:  None. IMPRESSION: Positive exam for acute left lower extremity femoral popliteal DVT. Negative for right lower extremity DVT. Electronically Signed   By: Jerilynn Mages.  Shick M.D.   On: 08/27/2016 14:35   Dg Chest Port 1 View  Result Date: 08/28/2016 CLINICAL DATA:  Acute respiratory failure EXAM: PORTABLE CHEST 1 VIEW COMPARISON:  08/27/2016 FINDINGS: Cardiac shadow is at the upper limits of normal in size. The lungs are well aerated with persistent right middle lobe infiltrate. No new focal infiltrate is seen. No sizable effusion is noted. No bony abnormality is seen. IMPRESSION: Stable right middle lobe infiltrate. Electronically Signed   By: Inez Catalina M.D.   On: 08/28/2016 07:29   Dg Chest Port 1 View  Result Date: 08/27/2016 CLINICAL DATA:  Cough and not feeling well 4 days.  Hypoxia. EXAM: PORTABLE CHEST 1 VIEW COMPARISON:  None.  FINDINGS: Lungs are adequately inflated with airspace consolidation over the right mid to lower lung likely right middle lobe pneumonia. No definite effusion. Subtle linear scarring/atelectasis left base. Cardiomediastinal silhouette is within normal. There is minimal calcified plaque over the aortic arch. Bones and soft tissues unremarkable. IMPRESSION: Airspace consolidation over the right midlung likely right middle lobe pneumonia. Aortic atherosclerosis. Electronically Signed   By: Marin Olp M.D.  On: 08/27/2016 10:38      Assessment/Plan 1. DVT with PE: At the present time the patient is tolerating his anticoagulation without incident. He was not on anticoagulation when he developed the left lower extremity recurrent DVT and therefore this does not represent failure of anticoagulation. Furthermore, he is sitting comfortably on his bed off his oxygen, (he was actually standing and walking around his room when I entered), and therefore I do not feel that he has profound pulmonary compromise. No evidence of bleeding or hemorrhage. Given these findings, I do not believe an IVC filter is warranted. Should any of these conditions change certainly we could revisit placement of a filter and the patient was informed of this. He concurs with this assessment.  With respect to thrombolytic therapy, given the chronic nature of his left lower extremity DVT with a acute component that was relatively asymptomatic in association with the fact that he informs me his leg is typically this large and has been for many years and he was not having any significant pain or changes that would've alerted him to an acute clot there does not appear to be any indication for thrombolytic therapy. Particularly in light of the chronic nature of the vast majority of his clot I do not believe the risk to benefit ratio is in his favor for moving forward with TPA infusion.  I have discussed at some length graduated compression,  elevation in particular keeping his ankles above his heart level as well as the likely need for lymph pump in the future. He has been wearing compression on and off for some time and expressed interest in becoming more aggressive with his compression therapy. Information on different compression socks and stockings as well as lymph pump will be furnished for the patient when he follows up with me in the office.     A total of 70 minutes was spent with this patient and greater than 50% was spent in counseling and coordination of care with the patient.  Discussion included the treatment options for vascular disease including indications for surgery and intervention.  Also discussed is the appropriate timing of treatment.  In addition medical therapy was discussed.  2. Headache: I noticed that his supplemental oxygen was not humidified and I am suspicious that this is dried out his nasal passages and sinuses and given him a headache. He already has a humidifier at home for this very same reason the dry air tends to give him headaches. I have asked that his supplemental oxygen be humidified and that Ocean Spray saline mist be delivered to the bedside for him to use frequently. He is also going for an MRI scan to ensure there are no other abnormalities or bleeds.  3. Pneumonia:  Patient will continue on IV antibiotics and supplemental oxygen.  4. Hyperlipidemia:  Continue statin therapy   Hortencia Pilar, MD  08/29/2016 12:19 PM    This note was created with Dragon medical transcription system.  Any error is purely unintentional

## 2016-08-29 NOTE — Progress Notes (Signed)
MD order for discharge home. Pt alert and oriented x 4, no signs of acute distress, denies pain. Pt given discharge instructions along with all personal belongings. Pt denied w/c transport. Ambulated out with his spouse. Transported via private vehicle.

## 2016-09-01 LAB — CULTURE, BLOOD (ROUTINE X 2)
Culture: NO GROWTH
Culture: NO GROWTH
SPECIAL REQUESTS: ADEQUATE
Special Requests: ADEQUATE

## 2016-09-28 ENCOUNTER — Encounter (INDEPENDENT_AMBULATORY_CARE_PROVIDER_SITE_OTHER): Payer: Self-pay | Admitting: Vascular Surgery

## 2016-09-28 ENCOUNTER — Ambulatory Visit (INDEPENDENT_AMBULATORY_CARE_PROVIDER_SITE_OTHER): Payer: Managed Care, Other (non HMO) | Admitting: Vascular Surgery

## 2016-09-28 DIAGNOSIS — I2699 Other pulmonary embolism without acute cor pulmonale: Secondary | ICD-10-CM

## 2016-09-28 DIAGNOSIS — I82419 Acute embolism and thrombosis of unspecified femoral vein: Secondary | ICD-10-CM | POA: Diagnosis not present

## 2016-09-28 DIAGNOSIS — I82409 Acute embolism and thrombosis of unspecified deep veins of unspecified lower extremity: Secondary | ICD-10-CM | POA: Insufficient documentation

## 2016-09-28 NOTE — Progress Notes (Signed)
MRN : [id number]  Randall Lester is a 41 y.o. (1976/02/14) male who presents with chief complaint of follow up for DVT.  History of Present Illness:  The patient presents to the office for evaluation of DVT.  DVT was identified at [facility] by Duplex ultrasound.  The initial symptoms were pain and swelling in the lower extremity.  The patient notes the leg continues to be very painful with dependency and swells quite a bite.  Symptoms are much better with elevation.  The patient notes minimal edema in the morning which steadily worsens throughout the day.    The patient has not been using compression therapy at this point.  No SOB or pleuritic chest pains.  No cough or hemoptysis.  No blood per rectum or blood in any sputum.  No excessive bruising per the patient.       No outpatient prescriptions have been marked as taking for the 09/28/16 encounter (Appointment) with Delana Meyer, Dolores Lory, MD.    Past Medical History:  Diagnosis Date  . DVT (deep venous thrombosis) ([place])   . Hypertension     No past surgical history on file.  Social History Social History  Substance Use Topics  . Smoking status: Never Smoker  . Smokeless tobacco: Never Used  . Alcohol use Yes    Family History No family history on file.  No Known Allergies   REVIEW OF SYSTEMS (Negative unless checked)  Constitutional: [] Weight loss  [] Fever  [] Chills Cardiac: [] Chest pain   [] Chest pressure   [] Palpitations   [] Shortness of breath when laying flat   [] Shortness of breath with exertion. Vascular:  [] Pain in legs with walking   [] Pain in legs at rest  [x] History of DVT   [] Phlebitis   [x] Swelling in legs   [] Varicose veins   [] Non-healing ulcers Pulmonary:   [] Uses home oxygen   [] Productive cough   [] Hemoptysis   [] Wheeze  [] COPD   [] Asthma Neurologic:  [] Dizziness   [] Seizures   [] History of stroke   [] History of TIA  [] Aphasia   [] Vissual changes   [] Weakness or numbness in arm   [] Weakness or  numbness in leg Musculoskeletal:   [] Joint swelling   [] Joint pain   [] Low back pain Hematologic:  [] Easy bruising  [] Easy bleeding   [] Hypercoagulable state   [] Anemic Gastrointestinal:  [] Diarrhea   [] Vomiting  [] Gastroesophageal reflux/heartburn   [] Difficulty swallowing. Genitourinary:  [] Chronic kidney disease   [] Difficult urination  [] Frequent urination   [] Blood in urine Skin:  [] Rashes   [] Ulcers  Psychological:  [] History of anxiety   []  History of major depression.  Physical Examination  There were no vitals filed for this visit. There is no height or weight on file to calculate BMI. Gen: WD/WN, NAD Head: [place]/AT, No temporalis wasting.  Ear/Nose/Throat: Hearing grossly intact, nares w/o erythema or drainage Eyes: PER, EOMI, sclera nonicteric.  Neck: Supple, no large masses.   Pulmonary:  Good air movement, no audible wheezing bilaterally, no use of accessory muscles.  Cardiac: RRR, no JVD Vascular:  Mild venous stasis changes to the legs bilaterally.  2+ soft pitting edema Vessel Right Left  Radial Palpable Palpable  PT Palpable Palpable  DP Palpable Palpable  Gastrointestinal: Non-distended. No guarding/no peritoneal signs.  Musculoskeletal: M/S 5/5 throughout.  No deformity or atrophy.  Neurologic: CN 2-12 intact. Symmetrical.  Speech is fluent. Motor exam as listed above. Psychiatric: Judgment intact, Mood & affect appropriate for pt's clinical situation. Dermatologic: No rashes or ulcers noted.  No changes consistent with cellulitis. Lymph : No lichenification or skin changes of chronic lymphedema.  CBC Lab Results  Component Value Date   WBC 9.0 08/29/2016   HGB 14.4 08/29/2016   HCT 42.5 08/29/2016   MCV 85.3 08/29/2016   PLT 148 (L) 08/29/2016    BMET    Component Value Date/Time   NA 138 08/29/2016 0513   K 4.0 08/29/2016 0513   CL 106 08/29/2016 0513   CO2 26 08/29/2016 0513   GLUCOSE 100 (H) 08/29/2016 0513   BUN 19 08/29/2016 0513   CREATININE  0.95 08/29/2016 0513   CALCIUM 8.6 (L) 08/29/2016 0513   GFRNONAA >60 08/29/2016 0513   GFRAA >60 08/29/2016 0513   CrCl cannot be calculated (Patient's most recent lab result is older than the maximum 21 days allowed.).  COAG Lab Results  Component Value Date   INR 1.07 08/27/2016    Radiology Mr Brain Wo Contrast  Result Date: 08/29/2016 CLINICAL DATA:  Headache EXAM: MRI HEAD WITHOUT CONTRAST TECHNIQUE: Multiplanar, multiecho pulse sequences of the brain and surrounding structures were obtained without intravenous contrast. COMPARISON:  CT head 09/30/2013 FINDINGS: Brain: Ventricle size and cerebral volume normal. Negative for acute infarct. Scattered small white matter hyperintensities in the frontal lobe bilaterally. Remaining white matter normal. Brainstem normal. Negative for hemorrhage or mass or edema. Vascular: Normal arterial flow voids. Skull and upper cervical spine: Negative Sinuses/Orbits: Mild mucosal edema paranasal sinuses.  Normal orbit Other: None IMPRESSION: No acute abnormality. Small hyperintensities in the frontal white matter bilaterally appear chronic. These are nonspecific and could be seen with chronic microvascular ischemia, migraine headaches, chronic white matter inflammatory disorders. Demyelinating disease not considered likely given the pattern Electronically Signed   By: Franchot Gallo M.D.   On: 08/29/2016 14:23    Assessment/Plan 1. Acute deep vein thrombosis (DVT) of femoral vein, unspecified laterality (HCC) Recommend:   No surgery or intervention at this point in time.  IVC filter is not indicated at present.  Patient's duplex ultrasound of the venous system shows DVT from the popliteal to the femoral veins.  The patient is initiated on anticoagulation   Elevation was stressed, use of a recliner was discussed.  I have had a long discussion with the patient regarding DVT and post phlebitic changes such as swelling and why it  causes symptoms such  as pain.  The patient will wear graduated compression stockings class 1 (20-30 mmHg), beginning after three full days of anticoagulation, on a daily basis a prescription was given. The patient will  beginning wearing the stockings first thing in the morning and removing them in the evening. The patient is instructed specifically not to sleep in the stockings.  In addition, behavioral modification including elevation during the day and avoidance of prolonged dependency will be initiated.    The patient will continue anticoagulation for now as there have not been any problems or complications at this point.    2. PE (pulmonary thromboembolism) ([place]) See #1    Hortencia Pilar, MD  09/28/2016 10:17 AM

## 2017-01-07 ENCOUNTER — Ambulatory Visit
Admission: EM | Admit: 2017-01-07 | Discharge: 2017-01-07 | Disposition: A | Discharge status: Home / Self Care | Payer: Managed Care, Other (non HMO) | Attending: Family Medicine | Admitting: Family Medicine

## 2017-01-07 ENCOUNTER — Encounter: Payer: Self-pay | Admitting: Gynecology

## 2017-01-07 ENCOUNTER — Ambulatory Visit (INDEPENDENT_AMBULATORY_CARE_PROVIDER_SITE_OTHER): Payer: Managed Care, Other (non HMO)

## 2017-01-07 DIAGNOSIS — Z86718 Personal history of other venous thrombosis and embolism: Secondary | ICD-10-CM | POA: Insufficient documentation

## 2017-01-07 DIAGNOSIS — R079 Chest pain, unspecified: Secondary | ICD-10-CM

## 2017-01-07 DIAGNOSIS — I10 Essential (primary) hypertension: Secondary | ICD-10-CM | POA: Diagnosis not present

## 2017-01-07 DIAGNOSIS — Z86711 Personal history of pulmonary embolism: Secondary | ICD-10-CM | POA: Insufficient documentation

## 2017-01-07 DIAGNOSIS — R0789 Other chest pain: Secondary | ICD-10-CM | POA: Diagnosis not present

## 2017-01-07 LAB — COMPREHENSIVE METABOLIC PANEL
ALBUMIN: 4 g/dL (ref 3.5–5.0)
ALK PHOS: 45 U/L (ref 38–126)
ALT: 35 U/L (ref 17–63)
ANION GAP: 7 (ref 5–15)
AST: 26 U/L (ref 15–41)
BUN: 14 mg/dL (ref 6–20)
CALCIUM: 9.1 mg/dL (ref 8.9–10.3)
CO2: 25 mmol/L (ref 22–32)
Chloride: 105 mmol/L (ref 101–111)
Creatinine, Ser: 0.9 mg/dL (ref 0.61–1.24)
GFR calc Af Amer: >60 mL/min (ref >60)
GFR calc non Af Amer: >60 mL/min (ref >60)
GLUCOSE: 108 mg/dL — AB (ref 65–99)
Potassium: 4.2 mmol/L (ref 3.5–5.1)
SODIUM: 137 mmol/L (ref 135–145)
Total Bilirubin: 0.6 mg/dL (ref 0.3–1.2)
Total Protein: 7.1 g/dL (ref 6.5–8.1)

## 2017-01-07 LAB — CBC
HCT: 47.2 % (ref 40.0–52.0)
HEMOGLOBIN: 15.9 g/dL (ref 13.0–18.0)
MCH: 29.1 pg (ref 26.0–34.0)
MCHC: 33.7 g/dL (ref 32.0–36.0)
MCV: 86.3 fL (ref 80.0–100.0)
Platelets: 176 10*3/uL (ref 150–440)
RBC: 5.47 MIL/uL (ref 4.40–5.90)
RDW: 13.5 % (ref 11.5–14.5)
WBC: 7.8 10*3/uL (ref 3.8–10.6)

## 2017-01-07 LAB — TROPONIN I: Troponin I: <0.03 ng/mL (ref <0.03)

## 2017-01-07 NOTE — ED Triage Notes (Signed)
Per patient c/o chest pain x last pm. Patient deny SOB.

## 2017-01-07 NOTE — Discharge Instructions (Signed)
If anything changes, go straight to the ER.  Your workup here was unremarkable.  Everything seems to be fine.  Take care  Dr. Lacinda Axon

## 2017-01-07 NOTE — ED Provider Notes (Signed)
[facility]    CSN: [id number] Arrival date & time: 01/07/17  0809   History   Chief Complaint Chief Complaint  Patient presents with  . Chest Pain   HPI  41 year old male with a recent history of DVT and PE (was hospitalized and also had sepsis at that time) presents with right-sided chest pain.  Patient reports that last night he developed mild chest pain on the right side.  No known inciting factor.  No associated shortness of breath.  Patient states that he started to get anxious about this and decided to go to bed.  He woke up this morning and the pain was still there.  Pain is intermittent.  Currently 1/10 in severity.  It has been as severe as 2-3/10.  Worse with inspiration.  No relieving factors.  No reports of diaphoresis.  No fever.  No cough.  He is currently on Eliquis for treatment of DVT/PE.  He had a recent lower extremity Doppler which revealed persistent clot.  Given his recent history, he is quite anxious.  No other reported symptoms.  No other complaints or concerns at this time.  Past Medical History:  Diagnosis Date  . DVT (deep venous thrombosis) ([place])   . Hypertension    Patient Active Problem List   Diagnosis Date Noted  . DVT (deep venous thrombosis) ([place]) 09/28/2016  . PE (pulmonary thromboembolism) ([place]) 09/28/2016   Surgical Hx: ARTHROSCOPIC REPAIR ACL  Left   LENS EYE SURGERY       Home Medications    Prior to Admission medications   Medication Sig Start Date End Date Taking? Authorizing Provider  apixaban (ELIQUIS) 5 MG TABS tablet Take 1 tablet (5 mg total) by mouth 2 (two) times daily. 10 mg PO bid for 5 more days followed by 5 mg PO bid for 6 months 09/04/16  Yes Max Sane, MD  Multiple Vitamin (MULTIVITAMIN) tablet Take 1 tablet by mouth daily.   Yes [provider]   Family History High blood pressure (Hypertension) Father    Obesity Father    Osteoarthritis Mother    Stroke Paternal Grandfather      Cataracts Paternal Grandmother     Social History Social History   Tobacco Use  . Smoking status: Never Smoker  . Smokeless tobacco: Never Used  Substance Use Topics  . Alcohol use: Yes  . Drug use: No   Allergies   Patient has no known allergies.  Review of Systems Review of Systems  Constitutional: Negative.   Respiratory: Negative for cough and shortness of breath.   Cardiovascular: Positive for chest pain.  Psychiatric/Behavioral: The patient is nervous/anxious.    Physical Exam Triage Vital Signs ED Triage Vitals  Enc Vitals Group     BP 01/07/17 0820 (!) 141/79     Pulse Rate 01/07/17 0820 91     Resp 01/07/17 0820 16     Temp 01/07/17 0820 98.3 F (36.8 C)     Temp Source 01/07/17 0820 Oral     SpO2 01/07/17 0820 100 %     Weight 01/07/17 0821 255 lb (115.7 kg)     Height 01/07/17 0821 6' 1"  (1.854 m)     Head Circumference --      Peak Flow --      Pain Score 01/07/17 0822 1     Pain Loc --      Pain Edu? --      Excl. in [place]? --    Updated Vital  Signs BP (!) 141/79 (BP Location: Left Arm)   Pulse 91   Temp 98.3 F (36.8 C) (Oral)   Resp 16   Ht 6' 1"  (1.854 m)   Wt 255 lb (115.7 kg)   SpO2 100%   BMI 33.64 kg/m    Physical Exam  Constitutional: He is oriented to person, place, and time. He appears well-developed and well-nourished. No distress.  HENT:  Head: Normocephalic and atraumatic.  Eyes: Conjunctivae are normal. No scleral icterus.  Cardiovascular: Normal rate and regular rhythm.  No murmur heard. Pulmonary/Chest: Effort normal and breath sounds normal. No respiratory distress. He has no wheezes. He has no rales.  Abdominal: Soft. He exhibits no distension. There is no tenderness.  Neurological: He is alert and oriented to person, place, and time.  Psychiatric: He has a normal mood and affect. His behavior is normal. Thought content normal.  Vitals reviewed.  UC Treatments / Results  Labs (all labs ordered are listed, but only  abnormal results are displayed) Labs Reviewed  COMPREHENSIVE METABOLIC PANEL - Abnormal; Notable for the following components:      Result Value   Glucose, Bld 108 (*)    All other components within normal limits  RAPID STREP SCREEN (NOT AT [facility])  CBC  TROPONIN I   ED ECG REPORT   Date: 01/07/2017  EKG Time: 9:46 AM  Rate: 79  Rhythm: Normal sinus rhythm.  Axis: Normal.  Intervals:Normal intervals.  ST&T Change: Isolated T wave inversion in lead III.  Narrative Interpretation: Normal sinus rhythm with rate of 79.  T wave inversion in lead III.  No contiguous ST/T wave changes noted.  Normal EKG.  Radiology Dg Chest 2 View  Result Date: 01/07/2017 CLINICAL DATA:  Right chest pain since last night. Previous pulmonary embolism. EXAM: CHEST  2 VIEW COMPARISON:  08/28/2016 and chest CTA dated 08/27/2016. FINDINGS: Normal sized heart. Tortuous aorta. Small amount of residual linear density at the left lung base. Otherwise, clear lungs with normal vascularity. Minimal diffuse peribronchial thickening. Unremarkable bones. IMPRESSION: 1. Minimal bronchitic changes. 2. Small amount residual linear atelectasis or scarring at the left lung base. Electronically Signed   By: Claudie Revering M.D.   On: 01/07/2017 09:33    Procedures Procedures (including critical care time)  Medications Ordered in UC Medications - No data to display   Initial Impression / Assessment and Plan / UC Course  I have reviewed the triage vital signs and the nursing notes.  Pertinent labs & imaging results that were available during my care of the patient were reviewed by me and considered in my medical decision making (see chart for details).    41 year old male presents with right-sided chest pain.  No hypoxia.  Normal exam.  Labs essentially normal.  Negative troponin.  Chest x-ray with atelectasis versus scarring.  I see no indication for CT imaging.  He has no clinical evidence of new acute PE.  Advised compliance  with Eliquis.  Reassurance provided.  I informed the patient that if he worsens, he should go directly to the ER..  Final Clinical Impressions(s) / UC Diagnoses   Final diagnoses:  Right-sided chest pain   Controlled Substance Prescriptions [place] Controlled Substance Registry consulted? Not Applicable   Coral Spikes, [place] 01/07/17 0881

## 2017-01-18 ENCOUNTER — Ambulatory Visit (INDEPENDENT_AMBULATORY_CARE_PROVIDER_SITE_OTHER): Payer: Managed Care, Other (non HMO) | Admitting: Vascular Surgery

## 2017-01-18 ENCOUNTER — Encounter (INDEPENDENT_AMBULATORY_CARE_PROVIDER_SITE_OTHER): Payer: Managed Care, Other (non HMO)

## 2018-06-13 IMAGING — MR MR HEAD W/O CM
10 series · 48 of 48 positions shown · non-contrast
Comparison: CT head 09/30/2013

CLINICAL DATA: Headache

EXAM:
MRI HEAD WITHOUT CONTRAST
TECHNIQUE: Multiplanar, multiecho pulse sequences of the brain and surrounding
structures were obtained without intravenous contrast.

[Series 2: T1 · sagittal · 5.0mm · 0.45mm/px · 2 of 25 slices shown (1 of 2)]
[im 1/25]
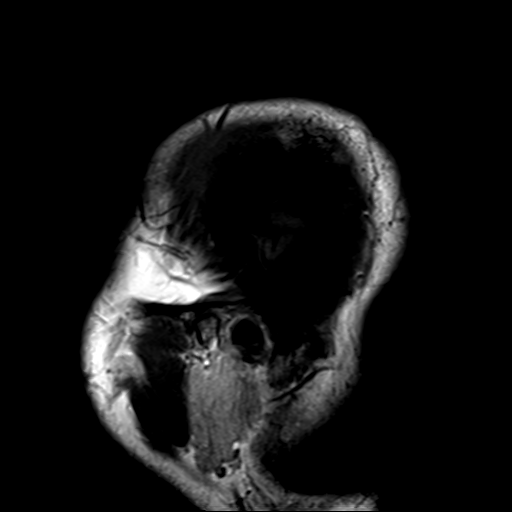
[im 25/25]
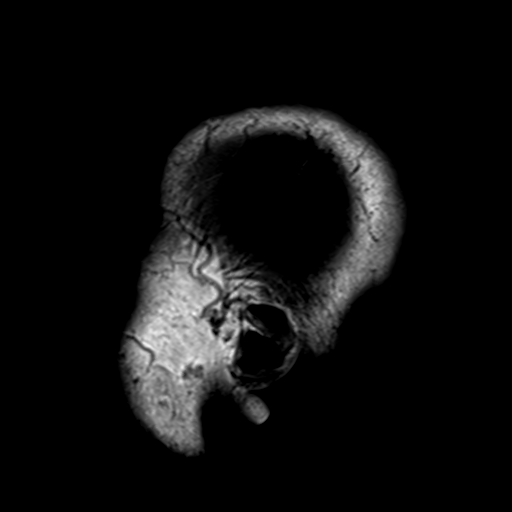

[Series 4: DWI · axial · 3.0mm · 1.80mm/px · z∈[-66,+101]mm · 5 of 57 slices shown (1 of 3)]
[im 1/57]
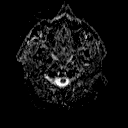
[im 15/57]
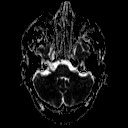
[im 29/57]
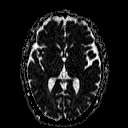
[im 43/57]
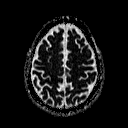
[im 57/57]
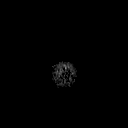

[Series 6: DWI · coronal · 3.0mm · 1.80mm/px · 4 of 47 slices shown (2 of 3)]
[im 1/47]
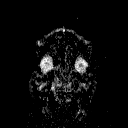
[im 16/47]
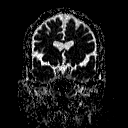
[im 31/47]
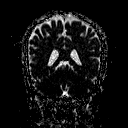
[im 47/47]
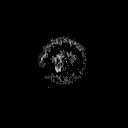

[Series 7: T2 · axial · 5.0mm · 0.60mm/px · z∈[-66,+102]mm · 2 of 27 slices shown (1 of 3)]
[im 1/27]
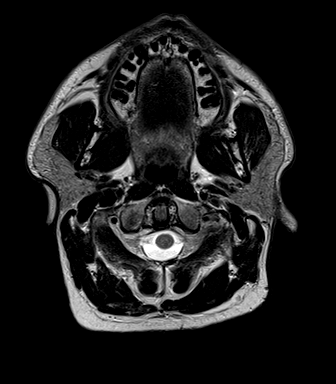
[im 27/27]
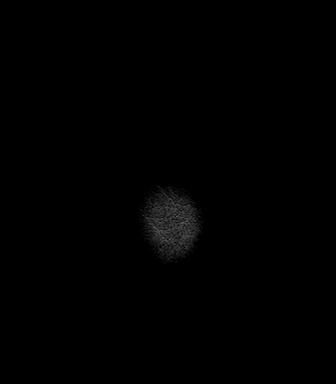

[Series 8: FLAIR · axial · 3.0mm · 0.45mm/px · z∈[-60,+95]mm · 5 of 53 slices shown]
[im 1/53]
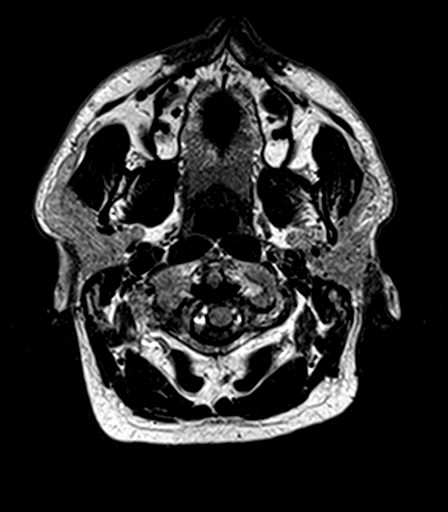
[im 14/53]
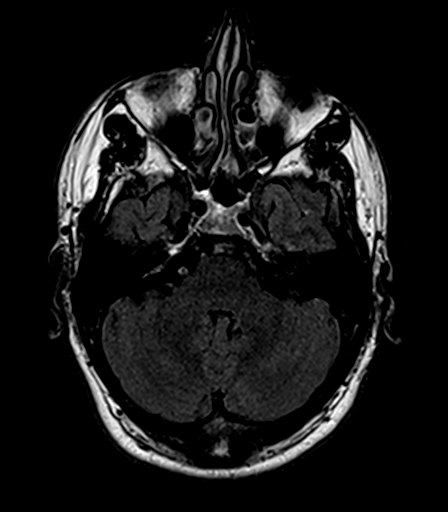
[im 27/53]
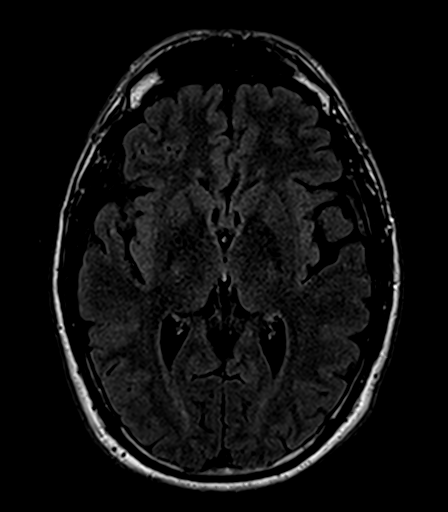
[im 40/53]
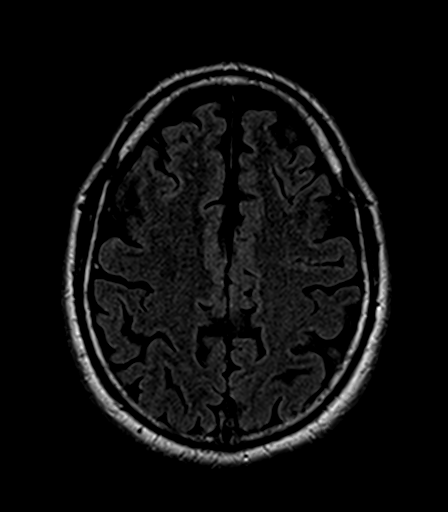
[im 53/53]
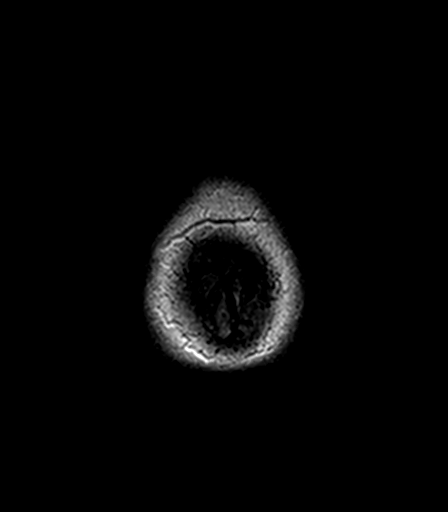

[Series 9: T2 · axial · 5.0mm · 0.45mm/px · z∈[-66,+102]mm · 2 of 27 slices shown (2 of 3)]
[im 1/27]
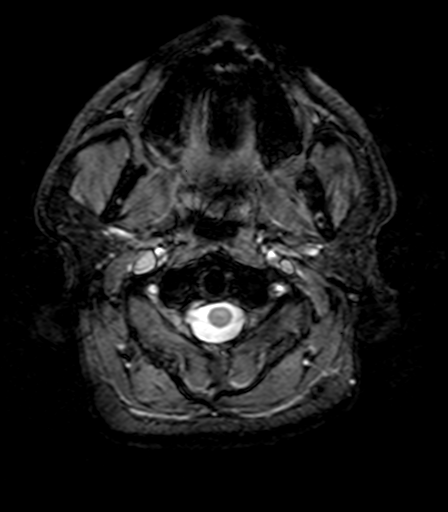
[im 27/27]
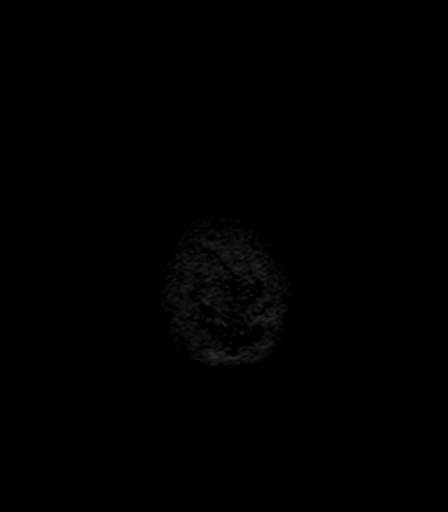

[Series 10: T1 · axial · 1.0mm · 0.90mm/px · z∈[-69,+105]mm · 16 of 176 slices shown (2 of 2)]
[im 1/176]
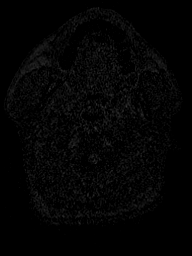
[im 12/176]
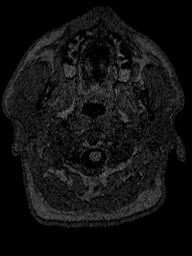
[im 24/176]
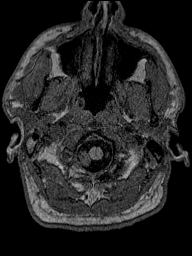
[im 36/176]
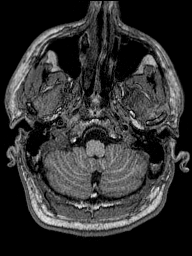
[im 47/176]
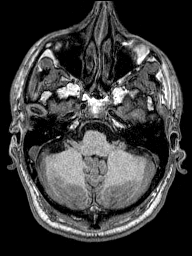
[im 59/176]
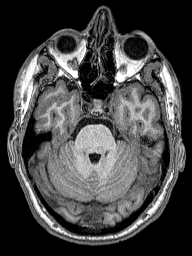
[im 71/176]
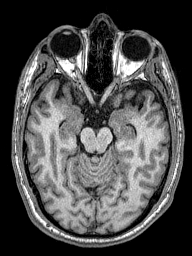
[im 82/176]
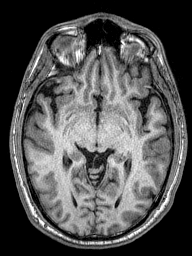
[im 94/176]
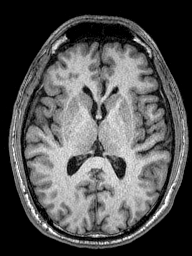
[im 106/176]
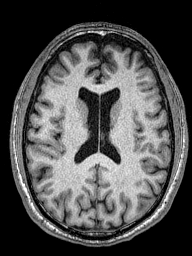
[im 117/176]
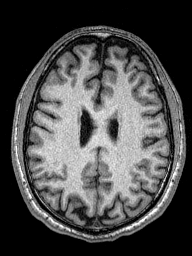
[im 129/176]
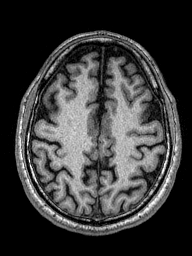
[im 141/176]
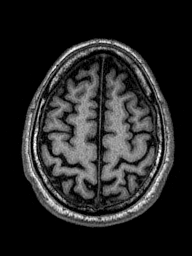
[im 152/176]
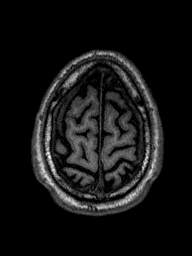
[im 164/176]
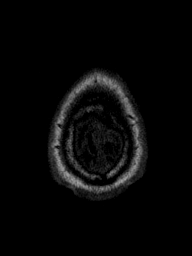
[im 176/176]
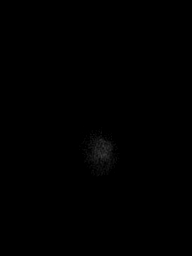

[Series 11: T2 · coronal · 5.0mm · 0.49mm/px · 3 of 29 slices shown (3 of 3)]
[im 1/29]
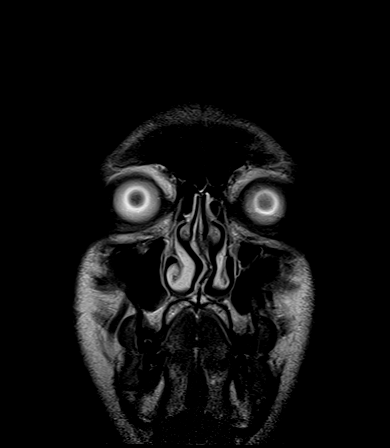
[im 15/29]
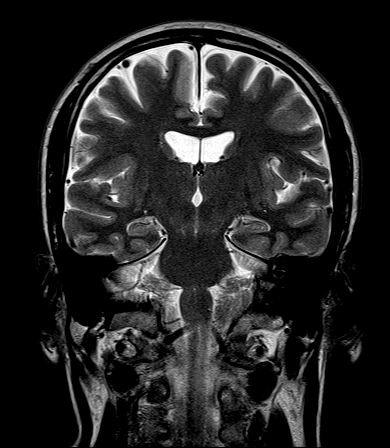
[im 29/29]
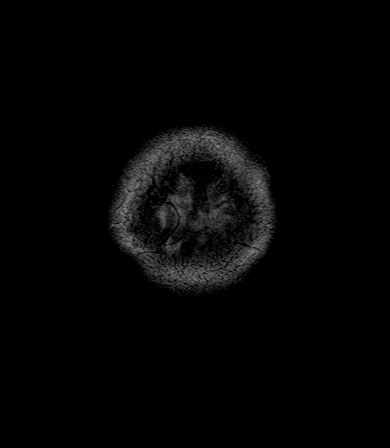

[Series 100: ax (id) · axial · 3.0mm · 1.80mm/px · z∈[-66,+101]mm · 5 of 57 slices shown]
[im 1/57]
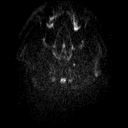
[im 15/57]
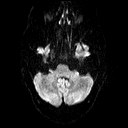
[im 29/57]
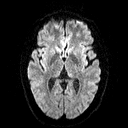
[im 43/57]
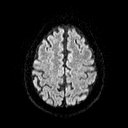
[im 57/57]
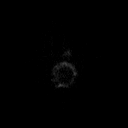

[Series 101: DWI · coronal · 3.0mm · 1.80mm/px · 4 of 46 slices shown (3 of 3)]
[im 1/46]
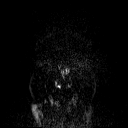
[im 16/46]
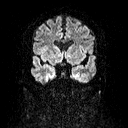
[im 31/46]
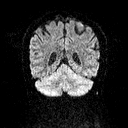
[im 46/46]
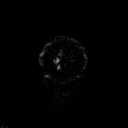

[48 of 48 positions shown; findings below may reference images not displayed]

FINDINGS: Brain: Ventricle size and cerebral volume normal. Negative for acute
infarct. Scattered small white matter hyperintensities in the
frontal lobe bilaterally. Remaining white matter normal. Brainstem
normal. Negative for hemorrhage or mass or edema.

Vascular: Normal arterial flow voids.

Skull and upper cervical spine: Negative

Sinuses/Orbits: Mild mucosal edema paranasal sinuses.  Normal orbit

Other: None
IMPRESSION: No acute abnormality. Small hyperintensities in the frontal white
matter bilaterally appear chronic. These are nonspecific and could
be seen with chronic microvascular ischemia, migraine headaches,
chronic white matter inflammatory disorders. Demyelinating disease
not considered likely given the pattern

## 2019-02-06 ENCOUNTER — Other Ambulatory Visit: Payer: Self-pay | Admitting: Orthopedic Surgery

## 2019-02-06 DIAGNOSIS — M25562 Pain in left knee: Secondary | ICD-10-CM

## 2019-02-20 ENCOUNTER — Other Ambulatory Visit: Payer: Self-pay

## 2019-02-20 ENCOUNTER — Ambulatory Visit
Admission: RE | Admit: 2019-02-20 | Discharge: 2019-02-20 | Disposition: A | Discharge status: Home / Self Care | Payer: 59 | Source: Ambulatory Visit | Attending: Orthopedic Surgery | Admitting: Orthopedic Surgery

## 2019-02-20 DIAGNOSIS — M25562 Pain in left knee: Secondary | ICD-10-CM | POA: Diagnosis not present

## 2020-12-22 ENCOUNTER — Encounter
Admission: RE | Disposition: A | Discharge status: Home / Self Care | Payer: Self-pay | Source: Home / Self Care | Attending: General Surgery

## 2020-12-22 ENCOUNTER — Ambulatory Visit: Payer: 59 | Admitting: Certified Registered"

## 2020-12-22 ENCOUNTER — Ambulatory Visit
Admission: RE | Admit: 2020-12-22 | Discharge: 2020-12-22 | Disposition: A | Discharge status: Home / Self Care | Payer: 59 | Attending: General Surgery | Admitting: General Surgery

## 2020-12-22 DIAGNOSIS — R195 Other fecal abnormalities: Secondary | ICD-10-CM | POA: Diagnosis present

## 2020-12-22 DIAGNOSIS — Z86711 Personal history of pulmonary embolism: Secondary | ICD-10-CM | POA: Diagnosis not present

## 2020-12-22 DIAGNOSIS — D123 Benign neoplasm of transverse colon: Secondary | ICD-10-CM | POA: Diagnosis not present

## 2020-12-22 DIAGNOSIS — Z7901 Long term (current) use of anticoagulants: Secondary | ICD-10-CM | POA: Diagnosis not present

## 2020-12-22 HISTORY — PX: COLONOSCOPY WITH PROPOFOL: SHX5780

## 2020-12-22 SURGERY — COLONOSCOPY WITH PROPOFOL
Anesthesia: General

## 2020-12-22 MED ORDER — PROPOFOL 10 MG/ML IV BOLUS
INTRAVENOUS | Status: DC | PRN
  Administered 2020-12-22: 90 mg via INTRAVENOUS

## 2020-12-22 MED ORDER — PROPOFOL 500 MG/50ML IV EMUL
INTRAVENOUS | Status: DC | PRN
  Administered 2020-12-22: 150 ug/kg/min via INTRAVENOUS

## 2020-12-22 MED ORDER — PROPOFOL 10 MG/ML IV BOLUS
INTRAVENOUS | Status: AC
  Filled 2020-12-22: qty 20

## 2020-12-22 MED ORDER — DEXMEDETOMIDINE (PRECEDEX) IN NS 20 MCG/5ML (4 MCG/ML) IV SYRINGE
PREFILLED_SYRINGE | INTRAVENOUS | Status: DC | PRN
  Administered 2020-12-22: 12 ug via INTRAVENOUS

## 2020-12-22 MED ORDER — SODIUM CHLORIDE 0.9 % IV SOLN: INTRAVENOUS | Status: DC | PRN

## 2020-12-22 MED ORDER — LIDOCAINE HCL (CARDIAC) PF 100 MG/5ML IV SOSY
PREFILLED_SYRINGE | INTRAVENOUS | Status: DC | PRN
  Administered 2020-12-22: 50 mg via INTRAVENOUS

## 2020-12-22 MED ORDER — APIXABAN 5 MG PO TABS: 5.0000 mg | ORAL_TABLET | Freq: Two times a day (BID) | ORAL | 0 refills | Status: DC

## 2020-12-22 MED ORDER — PHENYLEPHRINE HCL (PRESSORS) 10 MG/ML IV SOLN
INTRAVENOUS | Status: DC | PRN
  Administered 2020-12-22: 100 ug via INTRAVENOUS

## 2020-12-22 NOTE — Anesthesia Postprocedure Evaluation (Signed)
Anesthesia Post Note  Patient: Randall Lester  Procedure(s) Performed: COLONOSCOPY WITH PROPOFOL  Patient location during evaluation: Phase II Anesthesia Type: General Level of consciousness: awake and alert, awake and oriented Pain management: pain level controlled Vital Signs Assessment: post-procedure vital signs reviewed and stable Respiratory status: spontaneous breathing, nonlabored ventilation and respiratory function stable Cardiovascular status: blood pressure returned to baseline and stable Postop Assessment: no apparent nausea or vomiting Anesthetic complications: no   No notable events documented.   Last Vitals:  Vitals:   12/22/20 1020 12/22/20 1030  BP: 116/78 122/86  Pulse: 70 69  Resp: (!) 22 (!) 22  Temp:    SpO2: 100% 100%    Last Pain:  Vitals:   12/22/20 1000  TempSrc: Temporal  PainSc:                  Phill Mutter

## 2020-12-22 NOTE — Anesthesia Preprocedure Evaluation (Signed)
Anesthesia Evaluation  Patient identified by MRN, date of birth, ID band Patient awake    Reviewed: Allergy & Precautions, NPO status , Patient's Chart, lab work & pertinent test results  Airway Mallampati: II  TM Distance: >3 FB Neck ROM: Full    Dental  (+) Chipped,    Pulmonary neg pulmonary ROS,    Pulmonary exam normal        Cardiovascular hypertension (No Meds), Normal cardiovascular exam     Neuro/Psych negative neurological ROS  negative psych ROS   GI/Hepatic negative GI ROS, Neg liver ROS, Bowel prep,  Endo/Other  negative endocrine ROS  Renal/GU negative Renal ROS  negative genitourinary   Musculoskeletal negative musculoskeletal ROS (+)   Abdominal   Peds negative pediatric ROS (+)  Hematology negative hematology ROS (+)   Anesthesia Other Findings . Hypertension  . Anxiety  . Carpal tunnel syndrome  . Left Leg edema  . Acquired deformity of chest and rib  . PE (pulmonary thromboembolism) (CMS-HCC)  . Post-phlebitic syndrome  . History of pulmonary embolism  . Chronic anticoagulation    Reproductive/Obstetrics negative OB ROS                            Anesthesia Physical Anesthesia Plan  ASA: 3  Anesthesia Plan: General   Post-op Pain Management:    Induction: Intravenous  PONV Risk Score and Plan: 2 and Propofol infusion and TIVA  Airway Management Planned: Natural Airway and Nasal Cannula  Additional Equipment:   Intra-op Plan:   Post-operative Plan:   Informed Consent: I have reviewed the patients History and Physical, chart, labs and discussed the procedure including the risks, benefits and alternatives for the proposed anesthesia with the patient or authorized representative who has indicated his/her understanding and acceptance.       Plan Discussed with: CRNA, Anesthesiologist and Surgeon  Anesthesia Plan Comments:         Anesthesia  Quick Evaluation

## 2020-12-22 NOTE — H&P (Signed)
Randall Lester [id number] 05-16-1975     HPI:  45 y/o male with positive cologuard test. No symptoms.  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 Presently with no cardio-pulmonary symptoms. Last dose of Eliquis (2.5 mg) was 36 hours ago.   Medications Prior to Admission  Medication Sig Dispense Refill Last Dose   apixaban (ELIQUIS) 5 MG TABS tablet Take 1 tablet (5 mg total) by mouth 2 (two) times daily. 10 mg PO bid for 5 more days followed by 5 mg PO bid for 6 months 60 tablet 0 Past Week   Multiple Vitamin (MULTIVITAMIN) tablet Take 1 tablet by mouth daily.   12/21/2020   No Known Allergies Past Medical History:  Diagnosis Date   DVT (deep venous thrombosis) ([place])    Hypertension    No past surgical history on file. Social History   Socioeconomic History   Marital status: Married    Spouse name: Not on file   Number of children: Not on file   Years of education: Not on file   Highest education level: Not on file  Occupational History   Not on file  Tobacco Use   Smoking status: Never   Smokeless tobacco: Never  Substance and Sexual Activity   Alcohol use: Yes   Drug use: No   Sexual activity: Not on file  Other Topics Concern   Not on file  Social History Narrative   Not on file   Social Determinants of Health   Financial Resource Strain: Not on file  Food Insecurity: Not on file  Transportation Needs: Not on file  Physical Activity: Not on file  Stress: Not on file  Social Connections: Not on file  Intimate Partner Violence: Not on file   Social History   Social History Narrative   Not on file     ROS: Negative.     PE: HEENT: Negative. Lungs: Clear. Cardio: RR.   Assessment/Plan:  Proceed with planned colonoscopy.   Forest Gleason Marita Burnsed 12/22/2020

## 2020-12-22 NOTE — Transfer of Care (Signed)
Immediate Anesthesia Transfer of Care Note  Patient: Randall Lester  Procedure(s) Performed: COLONOSCOPY WITH PROPOFOL  Patient Location: PACU and Endoscopy Unit  Anesthesia Type:General  Level of Consciousness: drowsy  Airway & Oxygen Therapy: Patient Spontanous Breathing  Post-op Assessment: Report given to RN and Post -op Vital signs reviewed and stable  Post vital signs: Reviewed and stable  Last Vitals:  Vitals Value Taken Time  BP 98/70 12/22/20 1005  Temp    Pulse 72 12/22/20 1005  Resp    SpO2 100 % 12/22/20 1005  Vitals shown include unvalidated device data.  Last Pain:  Vitals:   12/22/20 0900  TempSrc: Temporal  PainSc: 0-No pain         Complications: No notable events documented.

## 2020-12-22 NOTE — Op Note (Signed)
[facility] Gastroenterology Patient Name: Randall Lester Procedure Date: 12/22/2020 9:00 AM MRN: [id number] Account #: [id number] Date of Birth: 06-28-1975 Admit Type: Outpatient Age: 45 Room: [facility] ENDO ROOM 1 Gender: Male Note Status: Finalized Instrument Name: Peds Colonoscope [id number] Procedure:             Colonoscopy Indications:           Positive Cologuard test Providers:             Robert Bellow, MD Referring MD:          Wynona Canes. Kym Groom, MD (Referring MD) Medicines:             Propofol per Anesthesia Complications:         No immediate complications. Procedure:             Pre-Anesthesia Assessment:                        - Prior to the procedure, a History and Physical was                         performed, and patient medications, allergies and                         sensitivities were reviewed. The patient's tolerance                         of previous anesthesia was reviewed.                        - The risks and benefits of the procedure and the                         sedation options and risks were discussed with the                         patient. All questions were answered and informed                         consent was obtained.                        After obtaining informed consent, the colonoscope was                         passed under direct vision. Throughout the procedure,                         the patient's blood pressure, pulse, and oxygen                         saturations were monitored continuously. The                         Colonoscope was introduced through the anus and                         advanced to the the cecum, identified by appendiceal  orifice and ileocecal valve. The colonoscopy was                         somewhat difficult due to a tortuous colon. Successful                         completion of the procedure was aided by using manual                         pressure.  The patient tolerated the procedure well.                         The quality of the bowel preparation was excellent. Findings:      A 5 mm polyp was found in the hepatic flexure. The polyp was sessile.       Biopsies were taken with a cold forceps for histology.      The retroflexed view of the distal rectum and anal verge was normal and       showed no anal or rectal abnormalities. Impression:            - One 5 mm polyp at the hepatic flexure. Biopsied.                        - The distal rectum and anal verge are normal on                         retroflexion view. Recommendation:        - Telephone endoscopist for pathology results in 1                         week.                        RESUME ELIQUIS WITH AM DOSE FRIDAY, OCTOBER 21. Procedure Code(s):     --- Professional ---                        [phone], Colonoscopy, flexible; with biopsy, single or                         multiple Diagnosis Code(s):     --- Professional ---                        K63.5, Polyp of colon                        R19.5, Other fecal abnormalities CPT copyright 2019 [employer]. All rights reserved. The codes documented in this report are preliminary and upon coder review may  be revised to meet current compliance requirements. Robert Bellow, MD 12/22/2020 10:03:26 AM This report has been signed electronically. Number of Addenda: 0 Note Initiated On: 12/22/2020 9:00 AM Scope Withdrawal Time: 0 hours 11 minutes 38 seconds  Total Procedure Duration: 0 hours 22 minutes 28 seconds  Estimated Blood Loss:  Estimated blood loss was minimal.      [facility]

## 2020-12-23 ENCOUNTER — Encounter: Payer: Self-pay | Admitting: General Surgery

## 2020-12-23 LAB — SURGICAL PATHOLOGY

## 2022-06-14 ENCOUNTER — Ambulatory Visit: Payer: 59 | Admitting: Urology

## 2022-06-14 ENCOUNTER — Encounter: Payer: Self-pay | Admitting: Urology

## 2022-06-14 VITALS — BP 136/81 | HR 61 | Ht 73.0 in | Wt 225.0 lb

## 2022-06-14 DIAGNOSIS — Z3009 Encounter for other general counseling and advice on contraception: Secondary | ICD-10-CM | POA: Diagnosis not present

## 2022-06-14 MED ORDER — DIAZEPAM 5 MG PO TABS: 5.0000 mg | ORAL_TABLET | Freq: Once | ORAL | 0 refills | Status: AC | PRN

## 2022-06-14 NOTE — Progress Notes (Signed)
   06/14/22 3:29 PM   Randall Lester 26-Jan-1976 [id number]  CC: Discuss vasectomy  HPI: 47 year old male who is interested in vasectomy for permanent sterilization.  He has 2 children from a prior relationship age 29 and 29.  He does not desire any further biologic pregnancies.  He takes Cialis through PCP with good results.  He also is on Eliquis for history of PE x 2, he has come off this previously for procedures.   PMH: Past Medical History:  Diagnosis Date   DVT (deep venous thrombosis)    Hypertension     Surgical History: Past Surgical History:  Procedure Laterality Date   COLONOSCOPY WITH PROPOFOL N/A 12/22/2020   Procedure: COLONOSCOPY WITH PROPOFOL;  Surgeon: Earline Mayotte, MD;  Location: [facility] ENDOSCOPY;  Service: Endoscopy;  Laterality: N/A;    Family History: No family history on file.  Social History:  reports that he has never smoked. He has never been exposed to tobacco smoke. He has never used smokeless tobacco. He reports current alcohol use. He reports that he does not use drugs.  Physical Exam: BP 136/81   Pulse 61   Ht 6\' 1"  (1.854 m)   Wt 225 lb (102.1 kg)   BMI 29.69 kg/m    Constitutional:  Alert and oriented, No acute distress. Cardiovascular: No clubbing, cyanosis, or edema. Respiratory: Normal respiratory effort, no increased work of breathing. GI: Abdomen is soft, nontender, nondistended, no abdominal masses GU: Uncircumcised phallus with patent meatus, no lesions, vas deferens easily palpable bilaterally, testicles 20 cc and descended   Assessment & Plan:   47 year old male interested in vasectomy for permanent sterilization.  Will need to stop Eliquis prior to vasectomy.  We discussed the risks and benefits of vasectomy at length.  Vasectomy is intended to be a permanent form of contraception, and does not produce immediate sterility.  Following vasectomy another form of contraception is required until vas occlusion is confirmed  by a post-vasectomy semen analysis obtained 2-3 months after the procedure.  Even after vas occlusion is confirmed, vasectomy is not 100% reliable in preventing pregnancy, and the failure rate is approximately 03/1998.  Repeat vasectomy is required in less than 1% of patients.  He should refrain from ejaculation for 1 week after vasectomy.  Options for fertility after vasectomy include vasectomy reversal, and sperm retrieval with in vitro fertilization or ICSI.  These options are not always successful and may be expensive.  Finally, there are other permanent and non-permanent alternatives to vasectomy available. There is no risk of erectile dysfunction, and the volume of semen will be similar to prior, as the majority of the ejaculate is from the prostate and seminal vesicles.   The procedure takes ~20 minutes.  We recommend patients take 5-10 mg of Valium 30 minutes prior, and he will need a driver post-procedure.  Local anesthetic is injected into the scrotal skin and a small segment of the vas deferens is removed, and the ends occluded. The complication rate is approximately 1-2%, and includes bleeding, infection, and development of chronic scrotal pain.  PLAN: Schedule vasectomy Valium sent to pharmacy   Legrand Rams, MD 06/14/2022  [facility] [address] [place], [place] [address] [phone]

## 2022-06-14 NOTE — Patient Instructions (Signed)

## 2022-08-09 ENCOUNTER — Encounter: Payer: 59 | Admitting: Urology

## 2022-08-23 ENCOUNTER — Encounter: Payer: 59 | Admitting: Urology
